# Patient Record
Sex: Female | Born: 2000 | Race: Black or African American | Hispanic: No | Marital: Single | Smoking: Never smoker
Health system: Southern US, Community
[De-identification: ages and names within clinical notes are randomized; demographics above are authoritative.]

## PROBLEM LIST (undated history)

## (undated) ENCOUNTER — Ambulatory Visit: Payer: No Typology Code available for payment source | Source: Home / Self Care

## (undated) DIAGNOSIS — L709 Acne, unspecified: Secondary | ICD-10-CM

## (undated) DIAGNOSIS — D649 Anemia, unspecified: Secondary | ICD-10-CM

## (undated) HISTORY — DX: Acne, unspecified: L70.9

## (undated) HISTORY — DX: Anemia, unspecified: D64.9

---
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
## 2020-01-10 ENCOUNTER — Encounter: Payer: Self-pay | Admitting: Dermatology

## 2020-01-10 ENCOUNTER — Ambulatory Visit (INDEPENDENT_AMBULATORY_CARE_PROVIDER_SITE_OTHER): Payer: 59 | Admitting: Dermatology

## 2020-01-10 ENCOUNTER — Other Ambulatory Visit: Payer: Self-pay

## 2020-01-10 VITALS — Wt 114.0 lb

## 2020-01-10 DIAGNOSIS — L700 Acne vulgaris: Secondary | ICD-10-CM | POA: Diagnosis not present

## 2020-01-10 DIAGNOSIS — L400 Psoriasis vulgaris: Secondary | ICD-10-CM

## 2020-01-10 MED ORDER — SULFAMETHOXAZOLE-TRIMETHOPRIM 400-80 MG PO TABS: 1.0000 | ORAL_TABLET | Freq: Two times a day (BID) | ORAL | 3 refills | Status: DC

## 2020-01-10 MED ORDER — FLUOCINOLONE ACETONIDE 0.01 % EX SOLN: Freq: Two times a day (BID) | CUTANEOUS | 3 refills | Status: DC

## 2020-01-10 NOTE — Progress Notes (Signed)
   New Patient   Subjective  Natalie Dixon is a 19 y.o. female who presents for the following: Acne (Mainly on her face - chin and forehead. Does notice an increase with her cycle.  Currently on birth control pill. Using witch hazel and Differn gel.) and Psoriasis (Ongoing in scalp. Still using Selsem Blue.  Itching is minimal and not too bad.  Is noticing more white spots along her hairline.  Possibly refill the Hytone lotion.).  Psoriasis, acne Location: scalp, ears, face Duration: years Quality: worse Associated Signs/Symptoms: Modifying Factors: minocycline Severity:  Timing: Context: patient interested in accutane   The following portions of the chart were reviewed this encounter and updated as appropriate:     Objective  Well appearing patient in no apparent distress; mood and affect are within normal limits.  A focused examination was performed including scalp, face, ears, neck. Relevant physical exam findings are noted in the Assessment and Plan. Focused examination today.  Her psoriasis of the scalp has slightly spread to the frontal hairline and ears.  It is causing mild dyschromia.  We reviewed the causes and treatment options.  She will use generic Synalar solution daily on the involved area; she will avoid this around the eyes.  She will let me know in 1 month how she is doing.  Her acne is of moderate severity with mostly inflammatory bumps.  I would like her to try one more oral antibiotic before deciding on Accutane.  She will take regular strength generic Bactrim twice daily; she understands the risk of vaginal yeast and rash.  If there is adequate improvement, she may choose to cancel her her Accutane start follow-up next month.   Assessment & Plan  oral smz-tmp for acne, synalar sln for psoriasis scalp.

## 2020-01-11 LAB — POCT URINE PREGNANCY: Preg Test, Ur: NEGATIVE

## 2020-01-25 ENCOUNTER — Ambulatory Visit: Payer: 59 | Attending: Internal Medicine

## 2020-01-25 DIAGNOSIS — Z23 Encounter for immunization: Secondary | ICD-10-CM

## 2020-01-25 NOTE — Progress Notes (Signed)
   Covid-19 Vaccination Clinic  Name:  Natalie Dixon    MRN: [id number] DOB: Jul 02, 2001  01/25/2020  Natalie Dixon was observed post Covid-19 immunization for 15 minutes without incident. She was provided with Vaccine Information Sheet and instruction to access the V-Safe system.   Natalie Dixon was instructed to call 911 with any severe reactions post vaccine: Marland Kitchen Difficulty breathing  . Swelling of face and throat  . A fast heartbeat  . A bad rash all over body  . Dizziness and weakness   Immunizations Administered    Name Date Dose VIS Date Route   Pfizer COVID-19 Vaccine 01/25/2020  3:26 PM 0.3 mL 09/23/2019 Intramuscular   Manufacturer: [employer], [employer]   Lot: [id number]   NDC: [address]-4

## 2020-02-08 ENCOUNTER — Other Ambulatory Visit: Payer: Self-pay

## 2020-02-08 ENCOUNTER — Emergency Department (HOSPITAL_BASED_OUTPATIENT_CLINIC_OR_DEPARTMENT_OTHER)
Admission: EM | Admit: 2020-02-08 | Discharge: 2020-02-08 | Disposition: A | Discharge status: Home / Self Care | Payer: No Typology Code available for payment source | Attending: Emergency Medicine | Admitting: Emergency Medicine

## 2020-02-08 ENCOUNTER — Emergency Department (HOSPITAL_BASED_OUTPATIENT_CLINIC_OR_DEPARTMENT_OTHER): Payer: No Typology Code available for payment source

## 2020-02-08 ENCOUNTER — Encounter (HOSPITAL_BASED_OUTPATIENT_CLINIC_OR_DEPARTMENT_OTHER): Payer: Self-pay | Admitting: Emergency Medicine

## 2020-02-08 DIAGNOSIS — Z79899 Other long term (current) drug therapy: Secondary | ICD-10-CM | POA: Insufficient documentation

## 2020-02-08 DIAGNOSIS — R1031 Right lower quadrant pain: Secondary | ICD-10-CM | POA: Diagnosis present

## 2020-02-08 DIAGNOSIS — N201 Calculus of ureter: Secondary | ICD-10-CM | POA: Diagnosis not present

## 2020-02-08 DIAGNOSIS — Z7722 Contact with and (suspected) exposure to environmental tobacco smoke (acute) (chronic): Secondary | ICD-10-CM | POA: Insufficient documentation

## 2020-02-08 DIAGNOSIS — Z793 Long term (current) use of hormonal contraceptives: Secondary | ICD-10-CM | POA: Insufficient documentation

## 2020-02-08 LAB — URINALYSIS, ROUTINE W REFLEX MICROSCOPIC
Bilirubin Urine: NEGATIVE
Glucose, UA: NEGATIVE mg/dL
Ketones, ur: NEGATIVE mg/dL
Leukocytes,Ua: NEGATIVE
Nitrite: NEGATIVE
Protein, ur: NEGATIVE mg/dL
Specific Gravity, Urine: >1.03 — ABNORMAL HIGH (ref 1.005–1.030)
pH: 5.5 (ref 5.0–8.0)

## 2020-02-08 LAB — URINALYSIS, MICROSCOPIC (REFLEX)

## 2020-02-08 LAB — PREGNANCY, URINE: Preg Test, Ur: NEGATIVE

## 2020-02-08 MED ORDER — KETOROLAC TROMETHAMINE 15 MG/ML IJ SOLN
15.0000 mg | Freq: Once | INTRAMUSCULAR | Status: AC
  Administered 2020-02-08: 15 mg via INTRAVENOUS
  Filled 2020-02-08: qty 1

## 2020-02-08 MED ORDER — FENTANYL CITRATE (PF) 100 MCG/2ML IJ SOLN
100.0000 ug | Freq: Once | INTRAMUSCULAR | Status: DC
  Filled 2020-02-08: qty 2

## 2020-02-08 MED ORDER — ONDANSETRON HCL 4 MG/2ML IJ SOLN
4.0000 mg | Freq: Once | INTRAMUSCULAR | Status: AC
  Administered 2020-02-08: 4 mg via INTRAVENOUS
  Filled 2020-02-08: qty 2

## 2020-02-08 MED ORDER — FENTANYL CITRATE (PF) 100 MCG/2ML IJ SOLN
50.0000 ug | Freq: Once | INTRAMUSCULAR | Status: AC
  Administered 2020-02-08: 05:00:00 50 ug via INTRAVENOUS

## 2020-02-08 MED ORDER — SODIUM CHLORIDE 0.9 % IV BOLUS
1000.0000 mL | Freq: Once | INTRAVENOUS | Status: AC
  Administered 2020-02-08: 05:00:00 1000 mL via INTRAVENOUS

## 2020-02-08 MED ORDER — OXYCODONE-ACETAMINOPHEN 5-325 MG PO TABS: 1.0000 | ORAL_TABLET | ORAL | 0 refills | Status: DC | PRN

## 2020-02-08 MED ORDER — ONDANSETRON 8 MG PO TBDP: 8.0000 mg | ORAL_TABLET | Freq: Three times a day (TID) | ORAL | 0 refills | Status: DC | PRN

## 2020-02-08 NOTE — ED Triage Notes (Signed)
Pt c/o right flank since yesterday afternoon.

## 2020-02-08 NOTE — ED Provider Notes (Signed)
[facility] Provider Note: Georgena Spurling, MD, FACEP  CSN: [id number] MRN: [id number] ARRIVAL: 02/08/20 at [place]: [place]  Flank Pain   HISTORY OF PRESENT ILLNESS  02/08/20 4:46 AM Natalie Dixon is a 19 y.o. female who complains of right flank pain radiating to her right lower quadrant.  It began yesterday afternoon about 3 PM then improved.  It returned about 3 AM this morning.  It is severe (10 out of 10) and not significantly affected by movement or palpation.  She has had nausea and vomiting in it.  She has not noted fever, chills, dysuria or hematuria.    Past Medical History:  Diagnosis Date  . Acne     Past Surgical History:  Procedure Laterality Date  . TONSILLECTOMY  2004    Family History  Problem Relation Age of Onset  . Hypertension Mother   . Heart attack Maternal Grandmother   . Hypertension Maternal Grandmother   . Hypertension Maternal Grandfather   . Migraines Neg Hx   . Seizures Neg Hx   . Depression Neg Hx   . Anxiety disorder Neg Hx   . Bipolar disorder Neg Hx   . ADD / ADHD Neg Hx   . Autism Neg Hx     Social History   Tobacco Use  . Smoking status: Passive Smoke Exposure - Never Smoker  . Smokeless tobacco: Never Used  Substance Use Topics  . Alcohol use: No    Alcohol/week: 0.0 standard drinks  . Drug use: No    Prior to Admission medications   Medication Sig Start Date End Date Taking? Authorizing Provider  etonogestrel (NEXPLANON) 68 MG IMPL implant 1 each by Subdermal route once.    [provider]  fluocinolone (SYNALAR) 0.01 % external solution Apply topically 2 (two) times daily. 01/10/20   Lavonna Monarch, MD  ondansetron (ZOFRAN ODT) 8 MG disintegrating tablet Take 1 tablet (8 mg total) by mouth every 8 (eight) hours as needed for nausea or vomiting. 02/08/20   Cacey Willow, Jenny Reichmann, MD  oxyCODONE-acetaminophen (PERCOCET) 5-325 MG tablet Take 1 tablet by mouth every 4 (four) hours as needed  for severe pain. 02/08/20   Murial Beam, Jenny Reichmann, MD    Allergies Patient has no known allergies.   REVIEW OF SYSTEMS  Negative except as noted here or in the History of Present Illness.   PHYSICAL EXAMINATION  Initial Vital Signs Blood pressure (!) 134/99, pulse (!) 122, temperature 98.7 F (37.1 C), temperature source Oral, resp. rate 18, height 5\' 1"  (1.549 m), weight 52.2 kg, SpO2 100 %.  Examination General: Well-developed, well-nourished female in no acute distress; appearance consistent with age of record HENT: normocephalic; atraumatic Eyes: pupils equal, round and reactive to light; extraocular muscles intact Neck: supple Heart: regular rate and rhythm Lungs: clear to auscultation bilaterally Abdomen: soft; nondistended; mild right lower quadrant tenderness; no masses or hepatosplenomegaly; bowel sounds present GU: No CVA tenderness Extremities: No deformity; full range of motion; pulses normal Neurologic: Awake, alert and oriented; motor function intact in all extremities and symmetric; no facial droop Skin: Warm and dry Psychiatric: Normal mood and affect   RESULTS  Summary of this visit's results, reviewed and interpreted by myself:   EKG Interpretation  Date/Time:    Ventricular Rate:    PR Interval:    QRS Duration:   QT Interval:    QTC Calculation:   R Axis:     Text Interpretation:  Laboratory Studies: Results for orders placed or performed during the hospital encounter of 02/08/20 (from the past 24 hour(s))  Urinalysis, Routine w reflex microscopic     Status: Abnormal   Collection Time: 02/08/20  4:45 AM  Result Value Ref Range   Color, Urine YELLOW YELLOW   APPearance HAZY (A) CLEAR   Specific Gravity, Urine >1.030 (H) 1.005 - 1.030   pH 5.5 5.0 - 8.0   Glucose, UA NEGATIVE NEGATIVE mg/dL   Hgb urine dipstick LARGE (A) NEGATIVE   Bilirubin Urine NEGATIVE NEGATIVE   Ketones, ur NEGATIVE NEGATIVE mg/dL   Protein, ur NEGATIVE NEGATIVE mg/dL    Nitrite NEGATIVE NEGATIVE   Leukocytes,Ua NEGATIVE NEGATIVE  Pregnancy, urine     Status: None   Collection Time: 02/08/20  4:45 AM  Result Value Ref Range   Preg Test, Ur NEGATIVE NEGATIVE  Urinalysis, Microscopic (reflex)     Status: Abnormal   Collection Time: 02/08/20  4:45 AM  Result Value Ref Range   RBC / HPF 21-50 0 - 5 RBC/hpf   WBC, UA 0-5 0 - 5 WBC/hpf   Bacteria, UA MANY (A) NONE SEEN   Squamous Epithelial / LPF 6-10 0 - 5   Mucus PRESENT    Imaging Studies: CT Renal Stone Study  Result Date: 02/08/2020 CLINICAL DATA:  Right-sided flank pain EXAM: CT ABDOMEN AND PELVIS WITHOUT CONTRAST TECHNIQUE: Multidetector CT imaging of the abdomen and pelvis was performed following the standard protocol without IV contrast. COMPARISON:  None. FINDINGS: Lower chest: The visualized heart size within normal limits. No pericardial fluid/thickening. No hiatal hernia. The visualized portions of the lungs are clear. Hepatobiliary: Although limited due to the lack of intravenous contrast, normal in appearance without gross focal abnormality. No evidence of calcified gallstones or biliary ductal dilatation. Pancreas:  Unremarkable.  No surrounding inflammatory changes. Spleen: Normal in size. Although limited due to the lack of intravenous contrast, normal in appearance. Adrenals/Urinary Tract: Both adrenal glands appear normal. There is mild right pelvicaliectasis and proximal ureterectasis seen. However there is no obstructing stones noted. There is mild perinephric stranding seen around the pole right kidney. No left-sided renal or collecting system calculi are seen. The bladder is partially decompressed. Stomach/Bowel: The stomach, small bowel, and colon are normal in appearance. No inflammatory changes or obstructive findings. appendix is normal. Vascular/Lymphatic: There are no enlarged abdominal or pelvic lymph nodes. No significant gross vascular findings are present. Reproductive: The uterus and  adnexa are unremarkable. Other: No evidence of abdominal wall mass or hernia. Musculoskeletal: No acute or significant osseous findings. IMPRESSION: Mild right pelvicaliectasis with a mild amount of perinephric stranding. No renal or collecting system calculi however are seen. This could be due to slightly passed stone or mild pyelonephritis. Electronically Signed   By: Jonna Clark M.D.   On: 02/08/2020 06:04    ED COURSE and MDM  Nursing notes, initial and subsequent vitals signs, including pulse oximetry, reviewed and interpreted by myself.  Vitals:   02/08/20 0440 02/08/20 0444  BP:  (!) 134/99  Pulse:  (!) 122  Resp:  18  Temp:  98.7 F (37.1 C)  TempSrc:  Oral  SpO2:  100%  Weight: 52.2 kg   Height: 5\' 1"  (1.549 m)    Medications  ketorolac (TORADOL) 15 MG/ML injection 15 mg (has no administration in time range)  ondansetron (ZOFRAN) injection 4 mg (4 mg Intravenous Given 02/08/20 0507)  fentaNYL (SUBLIMAZE) injection 50 mcg (50 mcg Intravenous Given 02/08/20 0507)  sodium chloride 0.9 % bolus 1,000 mL ( Intravenous Stopped 02/08/20 0624)   6:33 AM Suspect either passed stone or tiny stone not easily seen on CT as the patient's clinical presentation and another CT findings are consistent with ureterolithiasis.   PROCEDURES  Procedures   ED DIAGNOSES     ICD-10-CM   1. Ureterolithiasis  N20.1        Breckan Cafiero, MD 02/08/20 [phone]

## 2020-02-08 NOTE — ED Triage Notes (Signed)
Pt vomiting in triage 

## 2020-02-13 ENCOUNTER — Ambulatory Visit: Payer: Self-pay | Admitting: Dermatology

## 2020-02-16 ENCOUNTER — Ambulatory Visit (INDEPENDENT_AMBULATORY_CARE_PROVIDER_SITE_OTHER): Payer: 59 | Admitting: Physician Assistant

## 2020-02-16 ENCOUNTER — Encounter: Payer: Self-pay | Admitting: Physician Assistant

## 2020-02-16 ENCOUNTER — Other Ambulatory Visit: Payer: Self-pay

## 2020-02-16 DIAGNOSIS — Z5181 Encounter for therapeutic drug level monitoring: Secondary | ICD-10-CM | POA: Diagnosis not present

## 2020-02-16 DIAGNOSIS — L700 Acne vulgaris: Secondary | ICD-10-CM

## 2020-02-16 MED ORDER — ISOTRETINOIN 20 MG PO CAPS: 20.0000 mg | ORAL_CAPSULE | Freq: Every day | ORAL | 0 refills | Status: DC

## 2020-02-16 NOTE — Progress Notes (Signed)
   Isotretinoin Follow-Up Visit   Subjective  Natalie Dixon is a 19 y.o.AA female who presents for the following: Acne (Patient here today for Isotret new start).  The following portions of the chart were reviewed this encounter and updated as appropriate: Tobacco  Allergies  Meds  Problems  Med Hx  Surg Hx  Fam Hx      Isotretinoin F/U - 02/16/20 1100      Isotretinoin Follow Up   iPledge #  [id number]   labs done today   Date  02/16/20    Two Forms of Birth Control  Implant;Female Condom      Dosage   Target Dosage (mg)  6218       Review of Systems: No other skin or systemic complaints.  Objective  Well appearing patient in no apparent distress; mood and affect are within normal limits.  Face examined. Relevant physical exam findings are noted in the Assessment and Plan.  Objective  Head - Anterior (Face): Follicularly based papules and pustules with comedones   Assessment & Plan  Acne vulgaris Head - Anterior (Face)  Other Related Procedures CBC with Differential/Platelet Comprehensive metabolic panel Lipid panel hCG, serum, qualitative  Ordered Medications: ISOtretinoin (ACCUTANE) 20 MG capsule  Encounter for therapeutic drug monitoring  Other Related Procedures CBC with Differential/Platelet Comprehensive metabolic panel Lipid panel hCG, serum, qualitative

## 2020-02-17 LAB — CBC WITH DIFFERENTIAL/PLATELET
Absolute Monocytes: 324 cells/uL (ref 200–900)
Basophils Absolute: 29 cells/uL (ref 0–200)
Basophils Relative: 0.4 %
Eosinophils Absolute: 7 cells/uL — ABNORMAL LOW (ref 15–500)
Eosinophils Relative: 0.1 %
HCT: 39.4 % (ref 34.0–46.0)
Hemoglobin: 13 g/dL (ref 11.5–15.3)
Lymphs Abs: 1735 cells/uL (ref 1200–5200)
MCH: 30.2 pg (ref 25.0–35.0)
MCHC: 33 g/dL (ref 31.0–36.0)
MCV: 91.6 fL (ref 78.0–98.0)
MPV: 10 fL (ref 7.5–12.5)
Monocytes Relative: 4.5 %
Neutro Abs: 5105 cells/uL (ref 1800–8000)
Neutrophils Relative %: 70.9 %
Platelets: 287 10*3/uL (ref 140–400)
RBC: 4.3 10*6/uL (ref 3.80–5.10)
RDW: 11.5 % (ref 11.0–15.0)
Total Lymphocyte: 24.1 %
WBC: 7.2 10*3/uL (ref 4.5–13.0)

## 2020-02-17 LAB — LIPID PANEL
Cholesterol: 133 mg/dL (ref <170)
HDL: 43 mg/dL — ABNORMAL LOW (ref >45)
LDL Cholesterol (Calc): 76 mg/dL (calc) (ref <110)
Non-HDL Cholesterol (Calc): 90 mg/dL (calc) (ref <120)
Total CHOL/HDL Ratio: 3.1 (calc) (ref <5.0)
Triglycerides: 63 mg/dL (ref <90)

## 2020-02-17 LAB — COMPREHENSIVE METABOLIC PANEL
AG Ratio: 2 (calc) (ref 1.0–2.5)
ALT: 12 U/L (ref 5–32)
AST: 17 U/L (ref 12–32)
Albumin: 4.5 g/dL (ref 3.6–5.1)
Alkaline phosphatase (APISO): 63 U/L (ref 36–128)
BUN/Creatinine Ratio: 8 (calc) (ref 6–22)
BUN: 6 mg/dL — ABNORMAL LOW (ref 7–20)
CO2: 22 mmol/L (ref 20–32)
Calcium: 9.5 mg/dL (ref 8.9–10.4)
Chloride: 109 mmol/L (ref 98–110)
Creat: 0.78 mg/dL (ref 0.50–1.00)
Globulin: 2.3 g/dL (calc) (ref 2.0–3.8)
Glucose, Bld: 77 mg/dL (ref 65–99)
Potassium: 3.9 mmol/L (ref 3.8–5.1)
Sodium: 142 mmol/L (ref 135–146)
Total Bilirubin: 0.6 mg/dL (ref 0.2–1.1)
Total Protein: 6.8 g/dL (ref 6.3–8.2)

## 2020-02-17 LAB — HCG, SERUM, QUALITATIVE: Preg, Serum: NEGATIVE

## 2020-03-20 ENCOUNTER — Encounter: Payer: Self-pay | Admitting: Dermatology

## 2020-03-20 ENCOUNTER — Other Ambulatory Visit: Payer: Self-pay

## 2020-03-20 ENCOUNTER — Ambulatory Visit (INDEPENDENT_AMBULATORY_CARE_PROVIDER_SITE_OTHER): Payer: No Typology Code available for payment source | Admitting: Dermatology

## 2020-03-20 ENCOUNTER — Other Ambulatory Visit: Payer: Self-pay | Admitting: *Deleted

## 2020-03-20 DIAGNOSIS — L700 Acne vulgaris: Secondary | ICD-10-CM | POA: Diagnosis not present

## 2020-03-20 MED ORDER — HYDROCORTISONE 2.5 % EX LOTN: TOPICAL_LOTION | Freq: Two times a day (BID) | CUTANEOUS | 5 refills | Status: DC

## 2020-03-20 MED ORDER — ISOTRETINOIN 20 MG PO CAPS: 20.0000 mg | ORAL_CAPSULE | Freq: Every day | ORAL | 0 refills | Status: DC

## 2020-03-21 LAB — CBC WITH DIFFERENTIAL/PLATELET
Absolute Monocytes: 440 cells/uL (ref 200–900)
Basophils Absolute: 50 cells/uL (ref 0–200)
Basophils Relative: 0.8 %
Eosinophils Absolute: 19 cells/uL (ref 15–500)
Eosinophils Relative: 0.3 %
HCT: 38.6 % (ref 34.0–46.0)
Hemoglobin: 12.8 g/dL (ref 11.5–15.3)
Lymphs Abs: 2368 cells/uL (ref 1200–5200)
MCH: 29.9 pg (ref 25.0–35.0)
MCHC: 33.2 g/dL (ref 31.0–36.0)
MCV: 90.2 fL (ref 78.0–98.0)
MPV: 10 fL (ref 7.5–12.5)
Monocytes Relative: 7.1 %
Neutro Abs: 3323 cells/uL (ref 1800–8000)
Neutrophils Relative %: 53.6 %
Platelets: 244 10*3/uL (ref 140–400)
RBC: 4.28 10*6/uL (ref 3.80–5.10)
RDW: 11.8 % (ref 11.0–15.0)
Total Lymphocyte: 38.2 %
WBC: 6.2 10*3/uL (ref 4.5–13.0)

## 2020-03-21 LAB — LIPID PANEL
Cholesterol: 146 mg/dL (ref <170)
HDL: 41 mg/dL — ABNORMAL LOW (ref >45)
LDL Cholesterol (Calc): 80 mg/dL (calc) (ref <110)
Non-HDL Cholesterol (Calc): 105 mg/dL (calc) (ref <120)
Total CHOL/HDL Ratio: 3.6 (calc) (ref <5.0)
Triglycerides: 158 mg/dL — ABNORMAL HIGH (ref <90)

## 2020-03-21 LAB — COMPREHENSIVE METABOLIC PANEL
AG Ratio: 1.8 (calc) (ref 1.0–2.5)
ALT: 13 U/L (ref 5–32)
AST: 23 U/L (ref 12–32)
Albumin: 4.2 g/dL (ref 3.6–5.1)
Alkaline phosphatase (APISO): 61 U/L (ref 36–128)
BUN: 9 mg/dL (ref 7–20)
CO2: 22 mmol/L (ref 20–32)
Calcium: 9.6 mg/dL (ref 8.9–10.4)
Chloride: 109 mmol/L (ref 98–110)
Creat: 0.74 mg/dL (ref 0.50–1.00)
Globulin: 2.3 g/dL (calc) (ref 2.0–3.8)
Glucose, Bld: 92 mg/dL (ref 65–99)
Potassium: 3.7 mmol/L — ABNORMAL LOW (ref 3.8–5.1)
Sodium: 140 mmol/L (ref 135–146)
Total Bilirubin: 0.4 mg/dL (ref 0.2–1.1)
Total Protein: 6.5 g/dL (ref 6.3–8.2)

## 2020-03-21 LAB — HCG, SERUM, QUALITATIVE: Preg, Serum: NEGATIVE

## 2020-03-29 ENCOUNTER — Ambulatory Visit
Admission: EM | Admit: 2020-03-29 | Discharge: 2020-03-29 | Disposition: A | Discharge status: Home / Self Care | Payer: No Typology Code available for payment source

## 2020-03-29 ENCOUNTER — Encounter: Payer: Self-pay | Admitting: Emergency Medicine

## 2020-03-29 ENCOUNTER — Other Ambulatory Visit: Payer: Self-pay

## 2020-03-29 DIAGNOSIS — R21 Rash and other nonspecific skin eruption: Secondary | ICD-10-CM | POA: Diagnosis not present

## 2020-03-29 NOTE — ED Provider Notes (Signed)
[name]    CSN: [id number] Arrival date & time: 03/29/20  4401      History   Chief Complaint Chief Complaint  Patient presents with  . Rash    HPI Natalie Dixon is a 19 y.o. female.   19 year old female comes in for 2 week history of rash. States felt rash to the right lower arm after being at the salon. Denies itching, pain, erythema, warmth. This week, tried hydrocortisone, for which she felt burning sensation when applied. Now rash darker after applying hydrocortisone. Denies spreading of the rash.     Past Medical History:  Diagnosis Date  . Acne     Patient Active Problem List   Diagnosis Date Noted  . Tension headache, chronic 08/01/2016  . Surveillance of implantable subdermal contraceptive 12/04/2015  . Menorrhagia with regular cycle 08/30/2015  . Migraine without aura 08/30/2015  . Iron deficiency anemia 08/30/2015    Past Surgical History:  Procedure Laterality Date  . TONSILLECTOMY  2004    OB History   No obstetric history on file.      Home Medications    Prior to Admission medications   Medication Sig Start Date End Date Taking? Authorizing Provider  ISOtretinoin (ACCUTANE) 20 MG capsule Take 1 capsule (20 mg total) by mouth daily. 03/20/20  Yes Lavonna Monarch, MD  etonogestrel (NEXPLANON) 68 MG IMPL implant 1 each by Subdermal route once.    [provider]  fluocinolone (SYNALAR) 0.01 % external solution Apply topically 2 (two) times daily. 01/10/20   Lavonna Monarch, MD  hydrocortisone 2.5 % lotion Apply topically 2 (two) times daily. 03/20/20   Lavonna Monarch, MD    Family History Family History  Problem Relation Age of Onset  . Hypertension Mother   . Heart attack Maternal Grandmother   . Hypertension Maternal Grandmother   . Hypertension Maternal Grandfather   . Migraines Neg Hx   . Seizures Neg Hx   . Depression Neg Hx   . Anxiety disorder Neg Hx   . Bipolar disorder Neg Hx   . ADD / ADHD Neg Hx   .  Autism Neg Hx     Social History Social History   Tobacco Use  . Smoking status: Passive Smoke Exposure - Never Smoker  . Smokeless tobacco: Never Used  Vaping Use  . Vaping Use: Never used  Substance Use Topics  . Alcohol use: No    Alcohol/week: 0.0 standard drinks  . Drug use: No     Allergies   Patient has no known allergies.   Review of Systems Review of Systems  Reason unable to perform ROS: See HPI as above.     Physical Exam Triage Vital Signs ED Triage Vitals  Enc Vitals Group     BP 03/29/20 0900 120/73     Pulse Rate 03/29/20 0900 92     Resp 03/29/20 0900 16     Temp 03/29/20 0900 98.2 F (36.8 C)     Temp Source 03/29/20 0900 Oral     SpO2 03/29/20 0900 98 %     Weight --      Height --      Head Circumference --      Peak Flow --      Pain Score 03/29/20 0857 0     Pain Loc --      Pain Edu? --      Excl. in [place]? --    No data found.  Updated Vital  Signs BP 120/73 (BP Location: Left Arm)   Pulse 92   Temp 98.2 F (36.8 C) (Oral)   Resp 16   SpO2 98%   Physical Exam Constitutional:      General: She is not in acute distress.    Appearance: Normal appearance. She is well-developed. She is not toxic-appearing or diaphoretic.  HENT:     Head: Normocephalic and atraumatic.  Eyes:     Conjunctiva/sclera: Conjunctivae normal.     Pupils: Pupils are equal, round, and reactive to light.  Pulmonary:     Effort: Pulmonary effort is normal. No respiratory distress.     Comments: Speaking in full sentences without difficulty Musculoskeletal:     Cervical back: Normal range of motion and neck supple.  Skin:    General: Skin is warm and dry.     Comments: Maculopapular rash with hyperpigmentation and skin drying to the right medial proximal forearm. No tenderness to palpation. No erythema, warmth. No vesicular rash.  Neurological:     Mental Status: She is alert and oriented to person, place, and time.     UC Treatments / Results   Labs (all labs ordered are listed, but only abnormal results are displayed) Labs Reviewed - No data to display  EKG   Radiology No results found.  Procedures Procedures (including critical care time)  Medications Ordered in UC Medications - No data to display  Initial Impression / Assessment and Plan / UC Course  I have reviewed the triage vital signs and the nursing notes.  Pertinent labs & imaging results that were available during my care of the patient were reviewed by me and considered in my medical decision making (see chart for details).    Will have patient stop hydrocortisone for now and monitor. Moisturize and avoid further irritation. Return precautions given. Otherwise follow up with PCP/dermatology if symptoms does not improve.  Final Clinical Impressions(s) / UC Diagnoses   Final diagnoses:  Rash   ED Prescriptions    None     PDMP not reviewed this encounter.   Belinda Fisher, PA-C 03/29/20 [phone]

## 2020-03-29 NOTE — Discharge Instructions (Signed)
Stop hydrocortisone. Moisturize area and monitor for the week. Follow up with PCP if rash not improving.

## 2020-03-29 NOTE — ED Triage Notes (Signed)
2 weeks ago, thought she felt bumps on right arm after being at a salon.  Rash is on right lower arm at elbow.  Patient denies itching, rash has darker pigmentation, has used hydrocortisone on rash.  Rash burns with hydrocortisone application.

## 2020-04-13 ENCOUNTER — Encounter: Payer: Self-pay | Admitting: Dermatology

## 2020-04-13 NOTE — Progress Notes (Signed)
   Follow-Up Visit   Subjective  Natalie Dixon is a 19 y.o. female who presents for the following: Acne (31 day fasting taking isotretinoin 20mg ).  Acne Location: Predominantly face Duration:  Quality: Improved Associated Signs/Symptoms: Modifying Factors:  Severity:  Timing: Context:   The following portions of the chart were reviewed this encounter and updated as appropriate: Tobacco  Allergies  Meds  Problems  Med Hx  Surg Hx  Fam Hx      Objective  Well appearing patient in no apparent distress; mood and affect are within normal limits.  A focused examination was performed including Head and neck.. Relevant physical exam findings are noted in the Assessment and Plan.   Assessment & Plan  Acne vulgaris (2) Left Buccal Cheek ; Right Buccal Cheek   Continue isotretinoin 20 mg daily.  Pregnancy avoidance.  I pledge compliance.  Other Related Procedures CBC with Differential/Platelet Comprehensive metabolic panel Lipid panel hCG, serum, qualitative  Other Related Medications ISOtretinoin (ACCUTANE) 20 MG capsule hydrocortisone 2.5 % lotion

## 2020-04-23 ENCOUNTER — Ambulatory Visit (INDEPENDENT_AMBULATORY_CARE_PROVIDER_SITE_OTHER): Payer: BLUE CROSS/BLUE SHIELD | Admitting: Physician Assistant

## 2020-04-23 ENCOUNTER — Encounter: Payer: Self-pay | Admitting: Physician Assistant

## 2020-04-23 ENCOUNTER — Other Ambulatory Visit: Payer: Self-pay

## 2020-04-23 DIAGNOSIS — L700 Acne vulgaris: Secondary | ICD-10-CM

## 2020-04-23 DIAGNOSIS — Z5181 Encounter for therapeutic drug level monitoring: Secondary | ICD-10-CM | POA: Diagnosis not present

## 2020-04-23 MED ORDER — ISOTRETINOIN 30 MG PO CAPS: 30.0000 mg | ORAL_CAPSULE | Freq: Every day | ORAL | 0 refills | Status: DC

## 2020-04-23 NOTE — Progress Notes (Signed)
   Isotretinoin Follow-Up Visit   Subjective  Natalie Dixon is a 19 y.o. female who presents for the following: Follow-up (31 DAY). She feels like she is having less new bumps but still a mild breakout with menses. No headaches or joint aches. Using vaseline on lips.  The following portions of the chart were reviewed this encounter and updated as appropriate: Tobacco  Allergies  Meds  Problems  Med Hx  Surg Hx  Fam Hx       Isotretinoin F/U - 04/23/20 1000      Isotretinoin Follow Up   iPledge # [id number]    Date 04/23/20    Two Forms of Birth Control Implant;Female Condom      Dosage   Target Dosage (mg) 6218    Current (To Date) Dosage (mg) 1200    To Go Dosage (mg) 5018      Side Effects   Skin Chapped Lips    Gastrointestinal WNL    Neurological WNL    Constitutional WNL           Review of Systems: No other skin or systemic complaints.  Objective  Well appearing patient in no apparent distress; mood and affect are within normal limits.  Face examined. Relevant physical exam findings are noted in the Assessment and Plan.   Assessment & Plan  Acne vulgaris Head - Anterior (Face)  ISOtretinoin (CLARAVIS) 30 MG capsule - Head - Anterior (Face)  Other Related Procedures Pregnancy, urine  Other Related Medications hydrocortisone 2.5 % lotion  Encounter for therapeutic drug monitoring  Other Related Procedures Pregnancy, urine

## 2020-04-24 LAB — PREGNANCY, URINE: Preg Test, Ur: NEGATIVE

## 2020-05-24 ENCOUNTER — Ambulatory Visit (INDEPENDENT_AMBULATORY_CARE_PROVIDER_SITE_OTHER): Payer: No Typology Code available for payment source | Admitting: Physician Assistant

## 2020-05-24 ENCOUNTER — Other Ambulatory Visit: Payer: Self-pay

## 2020-05-24 ENCOUNTER — Encounter: Payer: Self-pay | Admitting: Physician Assistant

## 2020-05-24 DIAGNOSIS — L700 Acne vulgaris: Secondary | ICD-10-CM

## 2020-05-24 DIAGNOSIS — R21 Rash and other nonspecific skin eruption: Secondary | ICD-10-CM

## 2020-05-24 MED ORDER — TRIAMCINOLONE ACETONIDE 0.1 % EX CREA: 1.0000 | TOPICAL_CREAM | Freq: Two times a day (BID) | CUTANEOUS | 2 refills | Status: DC | PRN

## 2020-05-24 MED ORDER — ISOTRETINOIN 30 MG PO CAPS: 30.0000 mg | ORAL_CAPSULE | Freq: Every day | ORAL | 0 refills | Status: DC

## 2020-05-24 NOTE — Addendum Note (Signed)
Addended by: Judd Gaudier on: 05/24/2020 08:32 AM   Modules accepted: Orders

## 2020-05-24 NOTE — Progress Notes (Signed)
   Follow-Up Visit   Subjective  Natalie Dixon is a 19 y.o. female who presents for the following: Acne and Skin Problem (both arms have a rash/dry skin).   The following portions of the chart were reviewed this encounter and updated as appropriate: Tobacco  Allergies  Meds  Problems  Med Hx  Surg Hx  Fam Hx  She has no joint pain, mood swings, suicidal tendencies or headaches.   Objective  Well appearing patient in no apparent distress; mood and affect are within normal limits.  All skin waist up examined.  Objective  Head - Anterior (Face): Acne is completely clear.  Objective  Left Forearm - Anterior, Right Forearm - Anterior: Thin scaly erythematous papules both forearms. Isotretinoin induced  most likely. Scalp dermatitis clear.  Assessment & Plan  Acne vulgaris Head - Anterior (Face)  hydrocortisone 2.5 % lotion - Head - Anterior (Face)  Other Related Procedures Pregnancy, urine  Reordered Medications ISOtretinoin (CLARAVIS) 30 MG capsule  Rash and other nonspecific skin eruption (2) Left Forearm - Anterior; Right Forearm - Anterior  Moisturize after bathing.  triamcinolone cream (KENALOG) 0.1 % - Left Forearm - Anterior   I, Pasha Broad, PA-C, have reviewed all documentation's for this visit.  The documentation on 05/24/20 for the exam, diagnosis, procedures and orders are all accurate and complete.

## 2020-05-24 NOTE — Patient Instructions (Signed)
Isotretinoin capsules What is this medicine? ISOTRETINOIN (eye soe TRET i noyn) treats severe acne that has not responded to other therapy like antibiotics. This medicine may be used for other purposes; ask your health care provider or pharmacist if you have questions. COMMON BRAND NAME(S): Absorica, Absorica LD, Accutane, Amnesteem, Claravis, MYORISAN, Sotret, ZENATANE What should I tell my health care provider before I take this medicine? They need to know if you have any of these conditions:  heart disease  high blood cholesterol or triglycerides  inflammatory bowel disease  liver disease  mental problems, such as depression, psychosis, attempted suicide, or a family history of mental problems  osteoporosis, osteomalacia, or other bone disorders  pancreatitis  an unusual or allergic reaction to isotretinoin, vitamin A or related drugs, parabens, other medicines, foods, dyes, or preservatives  pregnant or trying to get pregnant  breast-feeding How should I use this medicine? Take this medicine by mouth with a full glass of water. Follow the directions on the prescription label. Do not chew or suck on the capsules. Take your medicine at regular intervals. Do not take your medicine more often than directed. Do not stop taking except on your doctor's advice. A special MedGuide will be given to you by the pharmacist with each prescription and refill. Be sure to read this information carefully each time. Talk to your pediatrician regarding the use of this medicine in children. Special care may be needed. Overdosage: If you think you have taken too much of this medicine contact a poison control center or emergency room at once. NOTE: This medicine is only for you. Do not share this medicine with others. What if I miss a dose? If you miss a dose, skip that dose. Do not take double or extra doses. If you take more than your prescribed dose, call your doctor or poison control center right  away. What may interact with this medicine? Do not take this medicine with any of the following medications:  vitamins and other supplements containing vitamin A This medicine may also interact with the following medications:  alcohol  benzoyl peroxide, salicylic acid, or other drying medicines used for acne  medicines for seizures  orlistat  other drugs that make you more sensitive to the sun such as sulfa drugs  progestin-only birth control hormones  st. john's wort  steroid medicines like prednisone or cortisone  tetracycline antibiotics like doxycycline and tetracycline  warfarin This list may not describe all possible interactions. Give your health care provider a list of all the medicines, herbs, non-prescription drugs, or dietary supplements you use. Also tell them if you smoke, drink alcohol, or use illegal drugs. Some items may interact with your medicine. What should I watch for while using this medicine? You may experience a flare in your acne during the initial treatment period. You will need to see your doctor or health care professional monthly to get a new prescription and to check on your progress and for side effects. To receive this medicine, you, your doctor and your pharmacy must be registered in the iPLED[employer] program. You may only receive up to a 30 day supply of this medicine at one time. You will need a new prescription for each refill. Your prescription must be filled within 7 days of your doctor's office visit. This medicine can cause birth defects. Do not get pregnant while taking this drug. Females will need to have 2 negative pregnancy tests before starting this medicine and then monthly pregnancy tests during treatment,   even if you are not sexually active. Use 2 reliable forms of birth control together for 1 month prior to, during, and for 1 month after stopping this medicine. Avoid using birth control pills that do not contain estrogen. They may not work  while you are taking this medicine. If you become pregnant, miss a menstrual cycle, or stop using birth control, you must immediately stop taking this medicine. If you are pregnant, report it to FDA MedWatch at 1-800-FDA-1088 and the iPL[employer] pregnancy registry at 1-866-495-0654. Severe birth defects may occur even if just one dose is taken. Do not breast-feed while taking this medicine or for 1 month after stopping treatment. Do not give blood while taking this medicine and for 30 days after completion of treatment to avoid exposing pregnant women to this medicine through the donated blood. Some patients have become depressed or developed serious mental problems while taking this medicine or soon after stopping. Stop taking this medicine if you start feeling depressed or have thoughts of violence or suicide. Contact your doctor. This medicine can increase cholesterol and triglyceride levels and decrease HDL (the good cholesterol) levels. Your health care provider will monitor these levels and recommend appropriate therapy, including changes in diet or prescription drugs, if necessary. Alcohol can increase the risk of developing high cholesterol or high blood lipids. Avoid alcoholic drinks while you are taking this medicine. If you wear contact lenses, they may feel uncomfortable. If your eyes get dry, check with your eye doctor. This medicine may decrease your night vision or cause other changes in vision. If you experience any change in vision, stop taking this medicine and see an eye doctor. This medicine can make you more sensitive to the sun. Keep out of the sun. If you cannot avoid being in the sun, wear protective clothing and use sunscreen. Do not use sun lamps or tanning beds/booths. Cosmetic procedures to smooth your skin including waxing, dermabrasion, or laser therapy should be avoided during therapy and for at least 6 months after you stop because of the possibility of scarring. Check with your  health care provider for advice about when you can have cosmetic procedures. This medicine may affect your blood sugar levels. If you have diabetes check with your doctor or health care professional if you notice any change in your blood sugar tests. What side effects may I notice from receiving this medicine? Side effects that you should report to your doctor or health care professional as soon as possible:  allergic reactions like skin rash, itching or hives, swelling of the face, lips, or tongue  breathing problems  changes in menstrual cycle  changes in vision, like blurred or double vision or decreased night vision  chest pain  depression  dizziness  fainting  hearing loss or ringing in the ears  hives, skin rash  increased irritability, anger, aggression or thoughts of violence  increased urination and/or thirst or dark urine  irregular heartbeat  loss of interest in usual activities  muscle or joint pain  muscle weakness with or without pain  nausea and vomiting  severe diarrhea  severe headache  severe stomach pain  slurred speech or trouble swallowing  start to have thoughts about hurting yourself  swelling of face or mouth  unusual bruising or bleeding  yellowing of the eyes or skin Side effects that usually do not require medical attention (report to your doctor or health care professional if they continue or are bothersome):  chapped lips  dry mouth, nose   or skin  flushing  hair loss, increased fragility of hair  headache (mild) This list may not describe all possible side effects. Call your doctor for medical advice about side effects. You may report side effects to FDA at 1-800-FDA-1088. Where should I keep my medicine? Keep out of the reach of children. Store at room temperature between 15 and 30 degrees C (59 and 86 degrees F). Throw away any unused medicine after the expiration date. NOTE: This sheet is a summary. It may not cover  all possible information. If you have questions about this medicine, talk to your doctor, pharmacist, or health care provider.  2020 Elsevier/Gold Standard (2008-02-15 16:57:13)  

## 2020-05-25 LAB — PREGNANCY, URINE: Preg Test, Ur: NEGATIVE

## 2020-06-22 DIAGNOSIS — R5383 Other fatigue: Secondary | ICD-10-CM | POA: Diagnosis not present

## 2020-06-25 ENCOUNTER — Ambulatory Visit (INDEPENDENT_AMBULATORY_CARE_PROVIDER_SITE_OTHER): Payer: No Typology Code available for payment source | Admitting: Dermatology

## 2020-06-25 ENCOUNTER — Other Ambulatory Visit: Payer: Self-pay

## 2020-06-25 ENCOUNTER — Encounter: Payer: Self-pay | Admitting: Dermatology

## 2020-06-25 DIAGNOSIS — L700 Acne vulgaris: Secondary | ICD-10-CM | POA: Diagnosis not present

## 2020-06-25 MED ORDER — ISOTRETINOIN 30 MG PO CAPS: 30.0000 mg | ORAL_CAPSULE | Freq: Every day | ORAL | 0 refills | Status: DC

## 2020-06-26 LAB — PREGNANCY, URINE: Preg Test, Ur: NEGATIVE

## 2020-07-12 DIAGNOSIS — N921 Excessive and frequent menstruation with irregular cycle: Secondary | ICD-10-CM | POA: Diagnosis not present

## 2020-07-12 DIAGNOSIS — Z0001 Encounter for general adult medical examination with abnormal findings: Secondary | ICD-10-CM | POA: Diagnosis not present

## 2020-07-12 DIAGNOSIS — Z0000 Encounter for general adult medical examination without abnormal findings: Secondary | ICD-10-CM | POA: Diagnosis not present

## 2020-07-12 DIAGNOSIS — Z113 Encounter for screening for infections with a predominantly sexual mode of transmission: Secondary | ICD-10-CM | POA: Diagnosis not present

## 2020-07-12 DIAGNOSIS — Z6821 Body mass index (BMI) 21.0-21.9, adult: Secondary | ICD-10-CM | POA: Diagnosis not present

## 2020-07-12 DIAGNOSIS — Z3046 Encounter for surveillance of implantable subdermal contraceptive: Secondary | ICD-10-CM | POA: Diagnosis not present

## 2020-07-20 ENCOUNTER — Encounter: Payer: Self-pay | Admitting: Dermatology

## 2020-07-20 NOTE — Progress Notes (Signed)
   Follow-Up Visit   Subjective  Natalie Dixon is a 19 y.o. female who presents for the following: Acne (31 days isotret).  Acne Location: Mostly face Duration:  Quality:  Associated Signs/Symptoms: Modifying Factors: Stretton none Severity:  Timing: Context:   Objective  Well appearing patient in no apparent distress; mood and affect are within normal limits.  A focused examination was performed including Head and neck.. Relevant physical exam findings are noted in the Assessment and Plan.   Assessment & Plan    Acne vulgaris (2) Head - Anterior (Face)  Continue current dosing.  Stay compliant with I pledge.  Avoid pregnancy.  Other Related Procedures Pregnancy, urine  Reordered Medications ISOtretinoin (CLARAVIS) 30 MG capsule  Other Related Medications hydrocortisone 2.5 % lotion     I, Janalyn Harder, MD, have reviewed all documentation for this visit.  The documentation on 07/20/20 for the exam, diagnosis, procedures, and orders are all accurate and complete.

## 2020-07-26 ENCOUNTER — Ambulatory Visit (INDEPENDENT_AMBULATORY_CARE_PROVIDER_SITE_OTHER): Payer: No Typology Code available for payment source | Admitting: Dermatology

## 2020-07-26 ENCOUNTER — Other Ambulatory Visit: Payer: Self-pay

## 2020-07-26 DIAGNOSIS — L700 Acne vulgaris: Secondary | ICD-10-CM | POA: Diagnosis not present

## 2020-07-26 MED ORDER — ISOTRETINOIN 30 MG PO CAPS: 30.0000 mg | ORAL_CAPSULE | Freq: Every day | ORAL | 0 refills | Status: DC

## 2020-07-27 LAB — PREGNANCY, URINE: Preg Test, Ur: NEGATIVE

## 2020-08-25 ENCOUNTER — Emergency Department (HOSPITAL_BASED_OUTPATIENT_CLINIC_OR_DEPARTMENT_OTHER)
Admission: EM | Admit: 2020-08-25 | Discharge: 2020-08-25 | Disposition: A | Discharge status: Home / Self Care | Payer: No Typology Code available for payment source | Attending: Emergency Medicine | Admitting: Emergency Medicine

## 2020-08-25 ENCOUNTER — Other Ambulatory Visit: Payer: Self-pay

## 2020-08-25 ENCOUNTER — Encounter (HOSPITAL_BASED_OUTPATIENT_CLINIC_OR_DEPARTMENT_OTHER): Payer: Self-pay | Admitting: Emergency Medicine

## 2020-08-25 DIAGNOSIS — M549 Dorsalgia, unspecified: Secondary | ICD-10-CM | POA: Diagnosis not present

## 2020-08-25 DIAGNOSIS — Z7722 Contact with and (suspected) exposure to environmental tobacco smoke (acute) (chronic): Secondary | ICD-10-CM | POA: Insufficient documentation

## 2020-08-25 DIAGNOSIS — N3000 Acute cystitis without hematuria: Secondary | ICD-10-CM

## 2020-08-25 DIAGNOSIS — R350 Frequency of micturition: Secondary | ICD-10-CM | POA: Diagnosis present

## 2020-08-25 LAB — URINALYSIS, ROUTINE W REFLEX MICROSCOPIC
Bilirubin Urine: NEGATIVE
Glucose, UA: NEGATIVE mg/dL
Hgb urine dipstick: NEGATIVE
Ketones, ur: NEGATIVE mg/dL
Nitrite: NEGATIVE
Protein, ur: NEGATIVE mg/dL
Specific Gravity, Urine: >1.03 — ABNORMAL HIGH (ref 1.005–1.030)
pH: 5.5 (ref 5.0–8.0)

## 2020-08-25 LAB — URINALYSIS, MICROSCOPIC (REFLEX)

## 2020-08-25 LAB — PREGNANCY, URINE: Preg Test, Ur: NEGATIVE

## 2020-08-25 MED ORDER — SULFAMETHOXAZOLE-TRIMETHOPRIM 800-160 MG PO TABS
1.0000 | ORAL_TABLET | Freq: Once | ORAL | Status: AC
  Administered 2020-08-25: 1 via ORAL
  Filled 2020-08-25: qty 1

## 2020-08-25 MED ORDER — SULFAMETHOXAZOLE-TRIMETHOPRIM 800-160 MG PO TABS: 1.0000 | ORAL_TABLET | Freq: Two times a day (BID) | ORAL | 0 refills | Status: AC

## 2020-08-25 MED ORDER — PHENAZOPYRIDINE HCL 100 MG PO TABS
200.0000 mg | ORAL_TABLET | Freq: Once | ORAL | Status: AC
  Administered 2020-08-25: 200 mg via ORAL
  Filled 2020-08-25: qty 2

## 2020-08-25 MED ORDER — PHENAZOPYRIDINE HCL 200 MG PO TABS
200.0000 mg | ORAL_TABLET | Freq: Three times a day (TID) | ORAL | 0 refills | Status: DC
Start: 2020-08-25 — End: 2021-01-08

## 2020-08-25 NOTE — ED Provider Notes (Signed)
[facility] EMERGENCY DEPARTMENT Provider Note   CSN: [id number] Arrival date & time: 08/25/20  1761     History Chief Complaint  Patient presents with  . Urinary Frequency    Natalie Dixon is a 19 y.o. female.  Pt presents to the ED today with urinary frequency.  She said it started on Wednesday, 11/10.  Pt has a little right sided back pain.  No f/c. No n/v. No vaginal d/c. No other sx.        Past Medical History:  Diagnosis Date  . Acne     Patient Active Problem List   Diagnosis Date Noted  . Tension headache, chronic 08/01/2016  . Surveillance of implantable subdermal contraceptive 12/04/2015  . Menorrhagia with regular cycle 08/30/2015  . Migraine without aura 08/30/2015  . Iron deficiency anemia 08/30/2015    Past Surgical History:  Procedure Laterality Date  . TONSILLECTOMY  2004     OB History   No obstetric history on file.     Family History  Problem Relation Age of Onset  . Hypertension Mother   . Heart attack Maternal Grandmother   . Hypertension Maternal Grandmother   . Hypertension Maternal Grandfather   . Migraines Neg Hx   . Seizures Neg Hx   . Depression Neg Hx   . Anxiety disorder Neg Hx   . Bipolar disorder Neg Hx   . ADD / ADHD Neg Hx   . Autism Neg Hx     Social History   Tobacco Use  . Smoking status: Passive Smoke Exposure - Never Smoker  . Smokeless tobacco: Never Used  Vaping Use  . Vaping Use: Never used  Substance Use Topics  . Alcohol use: No    Alcohol/week: 0.0 standard drinks  . Drug use: No    Home Medications Prior to Admission medications   Medication Sig Start Date End Date Taking? Authorizing Provider  etonogestrel (NEXPLANON) 68 MG IMPL implant 1 each by Subdermal route once.    [provider]  fluocinolone (SYNALAR) 0.01 % external solution Apply topically 2 (two) times daily. 01/10/20   Janalyn Harder, MD  hydrocortisone 2.5 % lotion Apply topically 2 (two) times daily.  03/20/20   Janalyn Harder, MD  ISOtretinoin (CLARAVIS) 30 MG capsule Take 1 capsule (30 mg total) by mouth daily. 07/26/20   Janalyn Harder, MD  phenazopyridine (PYRIDIUM) 200 MG tablet Take 1 tablet (200 mg total) by mouth 3 (three) times daily. 08/25/20   Jacalyn Lefevre, MD  sulfamethoxazole-trimethoprim (BACTRIM DS) 800-160 MG tablet Take 1 tablet by mouth 2 (two) times daily for 7 days. 08/25/20 09/01/20  Jacalyn Lefevre, MD  triamcinolone cream (KENALOG) 0.1 % Apply 1 application topically 2 (two) times daily as needed. 05/24/20   Glyn Ade, PA-C    Allergies    Patient has no known allergies.  Review of Systems   Review of Systems  Genitourinary: Positive for frequency.  All other systems reviewed and are negative.   Physical Exam Updated Vital Signs BP 128/80   Pulse 93   Temp 98.1 F (36.7 C) (Oral)   Resp 16   Ht 5\' 1"  (1.549 m)   Wt 49.9 kg   SpO2 100%   BMI 20.78 kg/m   Physical Exam Vitals and nursing note reviewed.  Constitutional:      Appearance: Normal appearance.  HENT:     Head: Normocephalic and atraumatic.     Right Ear: External ear normal.     Left  Ear: External ear normal.     Nose: Nose normal.     Mouth/Throat:     Mouth: Mucous membranes are moist.     Pharynx: Oropharynx is clear.  Eyes:     Extraocular Movements: Extraocular movements intact.     Conjunctiva/sclera: Conjunctivae normal.     Pupils: Pupils are equal, round, and reactive to light.  Cardiovascular:     Rate and Rhythm: Normal rate and regular rhythm.     Pulses: Normal pulses.     Heart sounds: Normal heart sounds.  Pulmonary:     Effort: Pulmonary effort is normal.     Breath sounds: Normal breath sounds.  Abdominal:     General: Abdomen is flat. Bowel sounds are normal.     Palpations: Abdomen is soft.  Musculoskeletal:        General: Normal range of motion.     Cervical back: Normal range of motion and neck supple.  Skin:    General: Skin is warm.      Capillary Refill: Capillary refill takes less than 2 seconds.  Neurological:     General: No focal deficit present.     Mental Status: She is alert and oriented to person, place, and time.  Psychiatric:        Mood and Affect: Mood normal.        Behavior: Behavior normal.     ED Results / Procedures / Treatments   Labs (all labs ordered are listed, but only abnormal results are displayed) Labs Reviewed  URINALYSIS, ROUTINE W REFLEX MICROSCOPIC - Abnormal; Notable for the following components:      Result Value   APPearance CLOUDY (*)    Specific Gravity, Urine >1.030 (*)    Leukocytes,Ua TRACE (*)    All other components within normal limits  URINALYSIS, MICROSCOPIC (REFLEX) - Abnormal; Notable for the following components:   Bacteria, UA MANY (*)    All other components within normal limits  PREGNANCY, URINE    EKG None  Radiology No results found.  Procedures Procedures (including critical care time)  Medications Ordered in ED Medications  sulfamethoxazole-trimethoprim (BACTRIM DS) 800-160 MG per tablet 1 tablet (1 tablet Oral Given 08/25/20 0741)  phenazopyridine (PYRIDIUM) tablet 200 mg (200 mg Oral Given 08/25/20 0741)    ED Course  I have reviewed the triage vital signs and the nursing notes.  Pertinent labs & imaging results that were available during my care of the patient were reviewed by me and considered in my medical decision making (see chart for details).    MDM Rules/Calculators/A&P                          Urine has some contamination, but does have many bacteria.  She has urinary sx, so I will treat her with bactrim/pyridium.  There is no blood and she does not appear to be in pain, so I doubt she has a kidney stone.  Pt is stable for d/c.  Return if worse.  Final Clinical Impression(s) / ED Diagnoses Final diagnoses:  Acute cystitis without hematuria    Rx / DC Orders ED Discharge Orders         Ordered    sulfamethoxazole-trimethoprim  (BACTRIM DS) 800-160 MG tablet  2 times daily        08/25/20 0733    phenazopyridine (PYRIDIUM) 200 MG tablet  3 times daily        08/25/20 4166  Jacalyn Lefevre, MD 08/25/20 [phone]

## 2020-08-25 NOTE — ED Triage Notes (Signed)
Pt c/o right flank pain and urinary frequency

## 2020-08-27 ENCOUNTER — Other Ambulatory Visit: Payer: Self-pay

## 2020-08-27 ENCOUNTER — Ambulatory Visit (INDEPENDENT_AMBULATORY_CARE_PROVIDER_SITE_OTHER): Payer: Medicaid Other | Admitting: Dermatology

## 2020-08-27 VITALS — Wt 110.0 lb

## 2020-08-27 DIAGNOSIS — L700 Acne vulgaris: Secondary | ICD-10-CM | POA: Diagnosis not present

## 2020-08-27 MED ORDER — ISOTRETINOIN 30 MG PO CAPS: 30.0000 mg | ORAL_CAPSULE | Freq: Every day | ORAL | 0 refills | Status: DC

## 2020-08-28 LAB — PREGNANCY, URINE: Preg Test, Ur: NEGATIVE

## 2020-08-31 ENCOUNTER — Encounter: Payer: Self-pay | Admitting: Dermatology

## 2020-08-31 NOTE — Progress Notes (Signed)
   Follow-Up Visit   Subjective  Sobia Karger is a 19 y.o. female who presents for the following: Acne (31 day follow up).  Cystic acne Location: Face Duration:  Quality: Clear Associated Signs/Symptoms: Modifying Factors: Isotretinoin Severity:  Timing: Context:   Objective  Well appearing patient in no apparent distress; mood and affect are within normal limits.  A focused examination was performed including Head and neck.. Relevant physical exam findings are noted in the Assessment and Plan.   Assessment & Plan    Acne vulgaris Head - Anterior (Face)  Continue current dosing.  Avoid pregnancy.  Will likely continue therapy for 2 months before stopping.  Pregnancy, urine - Head - Anterior (Face)  ISOtretinoin (CLARAVIS) 30 MG capsule - Head - Anterior (Face)     I, Janalyn Harder, MD, have reviewed all documentation for this visit.  The documentation on 08/31/20 for the exam, diagnosis, procedures, and orders are all accurate and complete.

## 2020-09-28 ENCOUNTER — Ambulatory Visit: Payer: No Typology Code available for payment source | Attending: Internal Medicine

## 2020-09-28 DIAGNOSIS — Z23 Encounter for immunization: Secondary | ICD-10-CM

## 2020-09-28 NOTE — Progress Notes (Signed)
° °  Covid-19 Vaccination Clinic  Name:  Calla Wedekind    MRN: [id number] DOB: 2001-06-12  09/28/2020  Ms. Nyako-Reed was observed post Covid-19 immunization for 15 minutes without incident. She was provided with Vaccine Information Sheet and instruction to access the V-Safe system.   Ms. Rudie Meyer was instructed to call 911 with any severe reactions post vaccine:  Difficulty breathing   Swelling of face and throat   A fast heartbeat   A bad rash all over body   Dizziness and weakness   Immunizations Administered    Name Date Dose VIS Date Route   Pfizer COVID-19 Vaccine 09/28/2020  3:14 PM 0.3 mL 08/01/2020 Intramuscular   Manufacturer: [employer], [employer]   Lot: [id number]   NDC: [address]-2

## 2020-10-01 ENCOUNTER — Other Ambulatory Visit: Payer: Self-pay

## 2020-10-01 ENCOUNTER — Encounter: Payer: Self-pay | Admitting: Dermatology

## 2020-10-01 ENCOUNTER — Ambulatory Visit (INDEPENDENT_AMBULATORY_CARE_PROVIDER_SITE_OTHER): Payer: No Typology Code available for payment source | Admitting: Dermatology

## 2020-10-01 DIAGNOSIS — L409 Psoriasis, unspecified: Secondary | ICD-10-CM

## 2020-10-01 DIAGNOSIS — L700 Acne vulgaris: Secondary | ICD-10-CM | POA: Diagnosis not present

## 2020-10-01 DIAGNOSIS — Z5181 Encounter for therapeutic drug level monitoring: Secondary | ICD-10-CM | POA: Diagnosis not present

## 2020-10-01 MED ORDER — ISOTRETINOIN 30 MG PO CAPS: 30.0000 mg | ORAL_CAPSULE | Freq: Every day | ORAL | 0 refills | Status: DC

## 2020-10-02 LAB — PREGNANCY, URINE: Preg Test, Ur: NEGATIVE

## 2020-10-03 ENCOUNTER — Ambulatory Visit: Payer: No Typology Code available for payment source | Admitting: Dermatology

## 2020-10-03 ENCOUNTER — Encounter: Payer: Self-pay | Admitting: Dermatology

## 2020-10-03 MED FILL — MYORISAN 30 MG CAPSULE: 30 | 30 days supply | Qty: 30 | Fill #0

## 2020-10-03 NOTE — Progress Notes (Signed)
   Follow-Up Visit   Subjective  Natalie Dixon is a 19 y.o. female who presents for the following: No chief complaint on file..  Acne Location: Face Duration:  Quality: Clear Associated Signs/Symptoms: Modifying Factors: Stretton none, final month Severity:  Timing: Context:   Objective  Well appearing patient in no apparent distress; mood and affect are within normal limits. Objective  Head - Anterior (Face): Inflammatory papules completely clear.  No sign of scar or PIH.    A focused examination was performed including Head and neck.. Relevant physical exam findings are noted in the Assessment and Plan.   Assessment & Plan    Acne vulgaris Head - Anterior (Face)  Continue current treatment. Enter patient as last month as therapy.  Continue I pledge compliance.  Call if minor flare in the future, return if significant deep inflammatory papules.  Pregnancy, urine - Head - Anterior (Face)  ISOtretinoin (CLARAVIS) 30 MG capsule - Head - Anterior (Face)  Encounter for medication monitoring  Other Related Procedures Pregnancy, urine  Psoriasis Mid Parietal Scalp  Okay use of triamcinolone the night before she shampoos her hair if the psoriasis returns after being off isotretinoin.  She may also use this if the eczematous patches on her hands do not clear after finishing the isotretinoin.      I, Janalyn Harder, MD, have reviewed all documentation for this visit.  The documentation on 10/03/20 for the exam, diagnosis, procedures, and orders are all accurate and complete.

## 2020-11-01 ENCOUNTER — Ambulatory Visit: Payer: No Typology Code available for payment source | Admitting: Dermatology

## 2020-11-01 DIAGNOSIS — Z1152 Encounter for screening for COVID-19: Secondary | ICD-10-CM | POA: Diagnosis not present

## 2020-12-03 ENCOUNTER — Ambulatory Visit: Payer: No Typology Code available for payment source | Admitting: Dermatology

## 2020-12-27 ENCOUNTER — Other Ambulatory Visit: Payer: Self-pay

## 2020-12-27 ENCOUNTER — Ambulatory Visit
Admission: RE | Admit: 2020-12-27 | Discharge: 2020-12-27 | Disposition: A | Discharge status: Home / Self Care | Payer: No Typology Code available for payment source | Source: Ambulatory Visit | Attending: Family Medicine | Admitting: Family Medicine

## 2020-12-27 VITALS — BP 108/71 | HR 71 | Temp 98.0°F | Resp 18

## 2020-12-27 DIAGNOSIS — J0140 Acute pansinusitis, unspecified: Secondary | ICD-10-CM

## 2020-12-27 MED ORDER — AMOXICILLIN-POT CLAVULANATE 875-125 MG PO TABS: 1.0000 | ORAL_TABLET | Freq: Two times a day (BID) | ORAL | 0 refills | Status: AC

## 2020-12-27 MED ORDER — FLUCONAZOLE 150 MG PO TABS: ORAL_TABLET | ORAL | 0 refills | Status: DC

## 2020-12-27 NOTE — ED Provider Notes (Signed)
[facility]   [id number] 12/27/20 Arrival Time: 1540  YP:PJKD THROAT  SUBJECTIVE: History from: patient.  Natalie Dixon is a 20 y.o. female who presents with abrupt onset of nasal congestion, headache, purulent nasal drainage, fatigue for the last 4 days. Reports an increase in allergy symptoms with nasal congestion for the last 10 days. Denies sick exposure to Covid, strep, flu or mono, or precipitating event. Has negative history of Covid. Has had Covid vaccines and booster. Has tried tylenol without relief.  There are no aggravating symptoms. Reports previous symptoms in the past.     Denies fever, chills, ear pain, rhinorrhea, cough, SOB, wheezing, chest pain, nausea, rash, changes in bowel or bladder habits.     ROS: As per HPI.  All other pertinent ROS negative.     Past Medical History:  Diagnosis Date  . Acne    Past Surgical History:  Procedure Laterality Date  . TONSILLECTOMY  2004   No Known Allergies No current facility-administered medications on file prior to encounter.   Current Outpatient Medications on File Prior to Encounter  Medication Sig Dispense Refill  . etonogestrel (NEXPLANON) 68 MG IMPL implant 1 each by Subdermal route once.    . fluocinolone (SYNALAR) 0.01 % external solution Apply topically 2 (two) times daily. 60 mL 3  . ISOtretinoin (CLARAVIS) 30 MG capsule Take 1 capsule (30 mg total) by mouth daily. 30 capsule 0  . phenazopyridine (PYRIDIUM) 200 MG tablet Take 1 tablet (200 mg total) by mouth 3 (three) times daily. 6 tablet 0  . triamcinolone cream (KENALOG) 0.1 % Apply 1 application topically 2 (two) times daily as needed. 80 g 2   Social History   Socioeconomic History  . Marital status: Single    Spouse name: Not on file  . Number of children: Not on file  . Years of education: Not on file  . Highest education level: Not on file  Occupational History  . Not on file  Tobacco Use  . Smoking status: Passive Smoke Exposure -  Never Smoker  . Smokeless tobacco: Never Used  Vaping Use  . Vaping Use: Never used  Substance and Sexual Activity  . Alcohol use: No    Alcohol/week: 0.0 standard drinks  . Drug use: No  . Sexual activity: Not Currently    Birth control/protection: Implant  Other Topics Concern  . Not on file  Social History Narrative   Signa is in the 10th grade at [employer]; she does well in school. She lives with mom and siblings. She does not play any sports.   Social Determinants of Health   Financial Resource Strain: Not on file  Food Insecurity: Not on file  Transportation Needs: Not on file  Physical Activity: Not on file  Stress: Not on file  Social Connections: Not on file  Intimate Partner Violence: Not on file   Family History  Problem Relation Age of Onset  . Hypertension Mother   . Heart attack Maternal Grandmother   . Hypertension Maternal Grandmother   . Hypertension Maternal Grandfather   . Migraines Neg Hx   . Seizures Neg Hx   . Depression Neg Hx   . Anxiety disorder Neg Hx   . Bipolar disorder Neg Hx   . ADD / ADHD Neg Hx   . Autism Neg Hx     OBJECTIVE:  Vitals:   12/27/20 1621  BP: 108/71  Pulse: 71  Resp: 18  Temp: 98 F (36.7 C)  SpO2: 96%  General appearance: alert; appears fatigued, but nontoxic, speaking in full sentences and managing own secretions HEENT: NCAT; Ears: EACs clear, TMs pearly gray with visible cone of light, without erythema; Eyes: PERRL, EOMI grossly; Nose: no obvious rhinorrhea; Throat: oropharynx clear, tonsils 1+ and mildly erythematous without white tonsillar exudates, cobblestoning present, uvula midline; Sinuses: frontal and maxillary sinuses tender to palpation Neck: supple with LAD Lungs: CTA bilaterally without adventitious breath sounds; cough absent Heart: regular rate and rhythm.  Radial pulses 2+ symmetrical bilaterally Skin: warm and dry Psychological: alert and cooperative; normal mood and affect  LABS: No results  found for this or any previous visit (from the past 24 hour(s)).   ASSESSMENT & PLAN:  1. Acute non-recurrent pansinusitis     Meds ordered this encounter  Medications  . amoxicillin-clavulanate (AUGMENTIN) 875-125 MG tablet    Sig: Take 1 tablet by mouth 2 (two) times daily for 7 days.    Dispense:  14 tablet    Refill:  0    Order Specific Question:   Supervising Provider    Answer:   Merrilee Jansky [id number]  . fluconazole (DIFLUCAN) 150 MG tablet    Sig: Take one tablet at the onset of symptoms. If symptoms are still present 3 days later, take the second tablet.    Dispense:  2 tablet    Refill:  0    Order Specific Question:   Supervising Provider    Answer:   Merrilee Jansky [id number]    Acute Sinusitis Push fluids and get rest Prescribed amoxicillin 875mg  twice daily for 7 days. Prescribed fluconazole in case of yeast   Take as directed and to completion.  Drink warm or cool liquids, use throat lozenges, or popsicles to help alleviate symptoms Take OTC ibuprofen or tylenol as needed for pain May use Zyrtec D and flonase to help alleviate symptoms Follow up with PCP if symptoms persist Return or go to ER if you have any new or worsening symptoms such as fever, chills, nausea, vomiting, worsening sore throat, cough, abdominal pain, chest pain, changes in bowel or bladder habits.   Reviewed expectations re: course of current medical issues. Questions answered. Outlined signs and symptoms indicating need for more acute intervention. Patient verbalized understanding. After Visit Summary given.          , NP 12/27/20 1700

## 2020-12-27 NOTE — ED Triage Notes (Signed)
Pt presents with nasal congestion and sinus pressure for over a week

## 2020-12-27 NOTE — Discharge Instructions (Signed)
I have sent in Augmentin for you to take twice a day for 7 days.  May take zyrtec daily  I have sent in fluconazole in case of yeast. Take one tablet at the onset of symptoms. If symptoms are still present in 3 days, take the second tablet.   Follow up with this office or with primary care if symptoms are persisting.  Follow up in the ER for high fever, trouble swallowing, trouble breathing, other concerning symptoms.

## 2021-01-08 ENCOUNTER — Other Ambulatory Visit: Payer: Self-pay

## 2021-01-08 ENCOUNTER — Ambulatory Visit
Admission: RE | Admit: 2021-01-08 | Discharge: 2021-01-08 | Disposition: A | Discharge status: Home / Self Care | Payer: No Typology Code available for payment source | Source: Ambulatory Visit | Attending: Family Medicine | Admitting: Family Medicine

## 2021-01-08 VITALS — BP 121/80 | HR 91 | Temp 98.4°F | Resp 18

## 2021-01-08 DIAGNOSIS — R0981 Nasal congestion: Secondary | ICD-10-CM

## 2021-01-08 MED ORDER — PREDNISONE 20 MG PO TABS
20.0000 mg | ORAL_TABLET | Freq: Every day | ORAL | 0 refills | Status: AC
Start: 2021-01-08 — End: 2021-01-13

## 2021-01-08 NOTE — ED Triage Notes (Signed)
Pt c/o nasal congestion, bilateral ear pain, and loss of smell/taste of over 2wks. States tx'd here last week for a sinus infection. States has one day left of antibiotics but not feeling any better.

## 2021-01-08 NOTE — ED Provider Notes (Signed)
[name]    CSN: [id number] Arrival date & time: 01/08/21  1544      History   Chief Complaint Chief Complaint  Patient presents with  . Nasal Congestion    HPI Natalie Dixon is a 20 y.o. female.   HPI Patient presents today with worsening nasal congestion following treatment of acute sinusitis.  Patient reports overall feeling better much improved from initial symptoms however she continues to have severe congestion involving her naris which is making it difficult to breathe.  She still taking the Zyrtec however is not taking any type of a decongestant and her main complaint is that she feels persistently stuffy.   Past Medical History:  Diagnosis Date  . Acne     Patient Active Problem List   Diagnosis Date Noted  . Tension headache, chronic 08/01/2016  . Surveillance of implantable subdermal contraceptive 12/04/2015  . Menorrhagia with regular cycle 08/30/2015  . Migraine without aura 08/30/2015  . Iron deficiency anemia 08/30/2015    Past Surgical History:  Procedure Laterality Date  . TONSILLECTOMY  2004    OB History   No obstetric history on file.      Home Medications    Prior to Admission medications   Medication Sig Start Date End Date Taking? Authorizing Provider  predniSONE (DELTASONE) 20 MG tablet Take 1 tablet (20 mg total) by mouth daily with breakfast for 5 days. 01/08/21 01/13/21 Yes Bing Neighbors, FNP  etonogestrel (NEXPLANON) 68 MG IMPL implant 1 each by Subdermal route once.    [provider]  fluconazole (DIFLUCAN) 150 MG tablet Take one tablet at the onset of symptoms. If symptoms are still present 3 days later, take the second tablet. 12/27/20   Moshe Cipro, NP  fluocinolone (SYNALAR) 0.01 % external solution Apply topically 2 (two) times daily. 01/10/20   Janalyn Harder, MD  triamcinolone cream (KENALOG) 0.1 % Apply 1 application topically 2 (two) times daily as needed. 05/24/20   Glyn Ade,  PA-C    Family History Family History  Problem Relation Age of Onset  . Hypertension Mother   . Heart attack Maternal Grandmother   . Hypertension Maternal Grandmother   . Hypertension Maternal Grandfather   . Migraines Neg Hx   . Seizures Neg Hx   . Depression Neg Hx   . Anxiety disorder Neg Hx   . Bipolar disorder Neg Hx   . ADD / ADHD Neg Hx   . Autism Neg Hx     Social History Social History   Tobacco Use  . Smoking status: Passive Smoke Exposure - Never Smoker  . Smokeless tobacco: Never Used  Vaping Use  . Vaping Use: Never used  Substance Use Topics  . Alcohol use: No    Alcohol/week: 0.0 standard drinks  . Drug use: No     Allergies   Patient has no known allergies.   Review of Systems Review of Systems Pertinent negatives listed in HPI   Physical Exam Triage Vital Signs ED Triage Vitals [01/08/21 1608]  Enc Vitals Group     BP 121/80     Pulse Rate 91     Resp 18     Temp 98.4 F (36.9 C)     Temp Source Oral     SpO2 98 %     Weight      Height      Head Circumference      Peak Flow      Pain Score 7  Pain Loc      Pain Edu?      Excl. in GC?    No data found.  Updated Vital Signs BP 121/80 (BP Location: Left Arm)   Pulse 91   Temp 98.4 F (36.9 C) (Oral)   Resp 18   SpO2 98%   Visual Acuity Right Eye Distance:   Left Eye Distance:   Bilateral Distance:    Right Eye Near:   Left Eye Near:    Bilateral Near:     Physical Exam  General Appearance:    Alert, cooperative, no distress  HENT:   Normocephalic, ears normal, nares mucosal edema with occlusion of the right turbinate left turbinate mildly patent with congestion,  oropharynx clear   Eyes:    PERRL, conjunctiva/corneas clear, EOM's intact       Lungs:     Clear to auscultation bilaterally, respirations unlabored  Heart:    Regular rate and rhythm  Neurologic:   Awake, alert, oriented x 3. No apparent focal neurological           defect.      UC Treatments /  Results  Labs (all labs ordered are listed, but only abnormal results are displayed) Labs Reviewed - No data to display  EKG   Radiology No results found.  Procedures Procedures (including critical care time)  Medications Ordered in UC Medications - No data to display  Initial Impression / Assessment and Plan / UC Course  I have reviewed the triage vital signs and the nursing notes.  Pertinent labs & imaging results that were available during my care of the patient were reviewed by me and considered in my medical decision making (see chart for details).     Treating her symptoms of nasal congestion with prednisone 20 mg once daily for 5 days.  Patient has significant occlusion of the right turbinate with mild patency of the left.  Recommended OTC Sudafed for intermittent management of nasal congestion however given the diffuse nature of current congestive symptoms prednisone is indicated.  Patient advised to complete any remaining doses of oral antibiotics.  PCP follow-up if symptoms do not completely resolve. Final Clinical Impressions(s) / UC Diagnoses   Final diagnoses:  Nasal congestion   Discharge Instructions   None    ED Prescriptions    Medication Sig Dispense Auth. Provider   predniSONE (DELTASONE) 20 MG tablet Take 1 tablet (20 mg total) by mouth daily with breakfast for 5 days. 5 tablet Bing Neighbors, FNP     PDMP not reviewed this encounter.   Bing Neighbors, FNP 01/08/21 [phone]

## 2021-01-10 ENCOUNTER — Emergency Department (HOSPITAL_BASED_OUTPATIENT_CLINIC_OR_DEPARTMENT_OTHER)
Admission: EM | Admit: 2021-01-10 | Discharge: 2021-01-10 | Disposition: A | Discharge status: Home / Self Care | Payer: No Typology Code available for payment source | Attending: Emergency Medicine | Admitting: Emergency Medicine

## 2021-01-10 ENCOUNTER — Encounter (HOSPITAL_BASED_OUTPATIENT_CLINIC_OR_DEPARTMENT_OTHER): Payer: Self-pay

## 2021-01-10 ENCOUNTER — Other Ambulatory Visit: Payer: Self-pay

## 2021-01-10 DIAGNOSIS — Z7722 Contact with and (suspected) exposure to environmental tobacco smoke (acute) (chronic): Secondary | ICD-10-CM | POA: Diagnosis not present

## 2021-01-10 DIAGNOSIS — R000 Tachycardia, unspecified: Secondary | ICD-10-CM | POA: Insufficient documentation

## 2021-01-10 DIAGNOSIS — R197 Diarrhea, unspecified: Secondary | ICD-10-CM | POA: Diagnosis not present

## 2021-01-10 DIAGNOSIS — R112 Nausea with vomiting, unspecified: Secondary | ICD-10-CM | POA: Insufficient documentation

## 2021-01-10 LAB — PREGNANCY, URINE: Preg Test, Ur: NEGATIVE

## 2021-01-10 MED ORDER — ONDANSETRON 4 MG PO TBDP: 4.0000 mg | ORAL_TABLET | Freq: Three times a day (TID) | ORAL | 0 refills | Status: DC | PRN

## 2021-01-10 MED ORDER — ONDANSETRON 4 MG PO TBDP
4.0000 mg | ORAL_TABLET | Freq: Once | ORAL | Status: AC
  Administered 2021-01-10: 4 mg via ORAL
  Filled 2021-01-10: qty 1

## 2021-01-10 NOTE — ED Provider Notes (Signed)
[facility] EMERGENCY DEPARTMENT Provider Note   CSN: [id number] Arrival date & time: 01/10/21  0136     History Chief Complaint  Patient presents with  . Emesis    Natalie Dixon is a 20 y.o. female.  HPI     This is a 20 year old female who presents with nausea, vomiting, and diarrhea.  Patient reports "I think I have food poisoning."  Patient reports onset of symptoms yesterday afternoon after eating [employer].  She reports multiple episodes of nonbilious, nonbloody emesis and diarrhea.  She reports ongoing nausea.  No real abdominal pain.  She denies any fevers or chills.  No known sick contacts or Covid exposures.  She is vaccinated.  She is not taking anything for her symptoms.  Denies chest pain, shortness of breath, cough, congestion.  Does not believe herself to be pregnant.  She is on birth control.  Past Medical History:  Diagnosis Date  . Acne     Patient Active Problem List   Diagnosis Date Noted  . Tension headache, chronic 08/01/2016  . Surveillance of implantable subdermal contraceptive 12/04/2015  . Menorrhagia with regular cycle 08/30/2015  . Migraine without aura 08/30/2015  . Iron deficiency anemia 08/30/2015    Past Surgical History:  Procedure Laterality Date  . TONSILLECTOMY  2004     OB History   No obstetric history on file.     Family History  Problem Relation Age of Onset  . Hypertension Mother   . Heart attack Maternal Grandmother   . Hypertension Maternal Grandmother   . Hypertension Maternal Grandfather   . Migraines Neg Hx   . Seizures Neg Hx   . Depression Neg Hx   . Anxiety disorder Neg Hx   . Bipolar disorder Neg Hx   . ADD / ADHD Neg Hx   . Autism Neg Hx     Social History   Tobacco Use  . Smoking status: Passive Smoke Exposure - Never Smoker  . Smokeless tobacco: Never Used  Vaping Use  . Vaping Use: Never used  Substance Use Topics  . Alcohol use: No    Alcohol/week: 0.0 standard drinks  . Drug  use: No    Home Medications Prior to Admission medications   Medication Sig Start Date End Date Taking? Authorizing Provider  ondansetron (ZOFRAN ODT) 4 MG disintegrating tablet Take 1 tablet (4 mg total) by mouth every 8 (eight) hours as needed. 01/10/21  Yes Joselynn Amoroso, Mayer Masker, MD  etonogestrel (NEXPLANON) 68 MG IMPL implant 1 each by Subdermal route once.    [provider]  fluconazole (DIFLUCAN) 150 MG tablet Take one tablet at the onset of symptoms. If symptoms are still present 3 days later, take the second tablet. 12/27/20   Moshe Cipro, NP  fluocinolone (SYNALAR) 0.01 % external solution Apply topically 2 (two) times daily. 01/10/20   Janalyn Harder, MD  predniSONE (DELTASONE) 20 MG tablet Take 1 tablet (20 mg total) by mouth daily with breakfast for 5 days. 01/08/21 01/13/21  Bing Neighbors, FNP  triamcinolone cream (KENALOG) 0.1 % Apply 1 application topically 2 (two) times daily as needed. 05/24/20   Glyn Ade, PA-C    Allergies    Patient has no known allergies.  Review of Systems   Review of Systems  Constitutional: Negative for fever.  Respiratory: Negative for shortness of breath.   Cardiovascular: Negative for chest pain.  Gastrointestinal: Positive for diarrhea, nausea and vomiting. Negative for abdominal pain.  Genitourinary: Negative for dysuria.  All other systems reviewed and are negative.   Physical Exam Updated Vital Signs BP 125/77 (BP Location: Right Arm)   Pulse (!) 124   Temp 98.6 F (37 C) (Oral)   Resp 16   Ht 1.549 m (5\' 1" )   Wt 49.9 kg   LMP 11/22/2020   SpO2 100%   BMI 20.78 kg/m   Physical Exam Vitals and nursing note reviewed.  Constitutional:      Appearance: She is well-developed. She is not ill-appearing.  HENT:     Head: Normocephalic and atraumatic.     Mouth/Throat:     Mouth: Mucous membranes are moist.  Eyes:     Pupils: Pupils are equal, round, and reactive to light.  Cardiovascular:     Rate and  Rhythm: Regular rhythm. Tachycardia present.     Heart sounds: Normal heart sounds.  Pulmonary:     Effort: Pulmonary effort is normal. No respiratory distress.     Breath sounds: No wheezing.  Abdominal:     General: Bowel sounds are normal.     Palpations: Abdomen is soft.     Tenderness: There is no abdominal tenderness. There is no guarding or rebound.  Musculoskeletal:     Cervical back: Neck supple.     Right lower leg: No edema.     Left lower leg: No edema.  Skin:    General: Skin is warm and dry.  Neurological:     Mental Status: She is alert and oriented to person, place, and time.  Psychiatric:        Mood and Affect: Mood normal.     ED Results / Procedures / Treatments   Labs (all labs ordered are listed, but only abnormal results are displayed) Labs Reviewed  PREGNANCY, URINE    EKG None  Radiology No results found.  Procedures Procedures   Medications Ordered in ED Medications  ondansetron (ZOFRAN-ODT) disintegrating tablet 4 mg (4 mg Oral Given 01/10/21 0209)    ED Course  I have reviewed the triage vital signs and the nursing notes.  Pertinent labs & imaging results that were available during my care of the patient were reviewed by me and considered in my medical decision making (see chart for details).    MDM Rules/Calculators/A&P                          Patient presents with nausea, vomiting, diarrhea.  She is nontoxic-appearing.  She is tachycardic but does not appear significantly dehydrated at this time.  She has no abdominal tenderness which argues against acute abdominal pathology such as appendicitis or cholecystitis.  Higher suspicion for viral etiology or foodborne pathogen.  Urine pregnancy is negative.  Patient was given Zofran.  After Zofran, she is able to orally hydrate.  On my repeat evaluation she states she feels much better.  Still slightly tachycardic at 105 but significantly improved.  Will discharge home with Zofran and  supportive measures  After history, exam, and medical workup I feel the patient has been appropriately medically screened and is safe for discharge home. Pertinent diagnoses were discussed with the patient. Patient was given return precautions.  Final Clinical Impression(s) / ED Diagnoses Final diagnoses:  Nausea vomiting and diarrhea    Rx / DC Orders ED Discharge Orders         Ordered    ondansetron (ZOFRAN ODT) 4 MG disintegrating tablet  Every 8 hours PRN  01/10/21 0409           Shon Baton, MD 01/10/21 [phone]

## 2021-01-10 NOTE — Discharge Instructions (Addendum)
You were seen today for nausea and vomiting.  This could be a viral illness versus food poisoning.  Take Zofran as needed.  Make sure that you are staying hydrated.

## 2021-01-10 NOTE — ED Notes (Signed)
Ginger ale given

## 2021-01-10 NOTE — ED Triage Notes (Signed)
Pt presents with n/v/d, abd pain and headache since 9pm last night. Says this started after having [employer] for dinner.

## 2021-01-13 ENCOUNTER — Ambulatory Visit: Payer: Self-pay

## 2021-01-13 ENCOUNTER — Encounter: Payer: Self-pay | Admitting: Emergency Medicine

## 2021-01-13 ENCOUNTER — Other Ambulatory Visit: Payer: Self-pay

## 2021-01-13 ENCOUNTER — Ambulatory Visit
Admission: EM | Admit: 2021-01-13 | Discharge: 2021-01-13 | Disposition: A | Discharge status: Home / Self Care | Payer: No Typology Code available for payment source | Attending: Urgent Care | Admitting: Urgent Care

## 2021-01-13 DIAGNOSIS — R197 Diarrhea, unspecified: Secondary | ICD-10-CM

## 2021-01-13 DIAGNOSIS — R112 Nausea with vomiting, unspecified: Secondary | ICD-10-CM

## 2021-01-13 DIAGNOSIS — K529 Noninfective gastroenteritis and colitis, unspecified: Secondary | ICD-10-CM | POA: Diagnosis not present

## 2021-01-13 MED ORDER — LOPERAMIDE HCL 2 MG PO CAPS: 2.0000 mg | ORAL_CAPSULE | Freq: Two times a day (BID) | ORAL | 0 refills | Status: DC | PRN

## 2021-01-13 NOTE — ED Provider Notes (Signed)
[facility]   MRN: [id number] DOB: Apr 04, 2001  Subjective:   Natalie Dixon is a 20 y.o. female presenting for 4-day history of persistent diarrhea.  Patient was already seen through the emergency room, states that she had fever, nausea, vomiting and abdominal pain with her diarrhea initially.  However everything has improved except for an upset stomach, diarrhea.  Patient has been using Zofran with good success except for the diarrhea.  She has been able to drink fluids, hold foods down.  Wants to make sure that she is not severely dehydrated and see if she could get a medication for the diarrhea.  Denies any bloody stools, recent hospitalizations, recent antibiotic use, long distance travel, history of ulcerative colitis, diverticulitis or any other chronic GI issue.  No current facility-administered medications for this encounter.  Current Outpatient Medications:  .  etonogestrel (NEXPLANON) 68 MG IMPL implant, 1 each by Subdermal route once., Disp: , Rfl:  .  fluconazole (DIFLUCAN) 150 MG tablet, Take one tablet at the onset of symptoms. If symptoms are still present 3 days later, take the second tablet., Disp: 2 tablet, Rfl: 0 .  fluocinolone (SYNALAR) 0.01 % external solution, Apply topically 2 (two) times daily., Disp: 60 mL, Rfl: 3 .  ondansetron (ZOFRAN ODT) 4 MG disintegrating tablet, Take 1 tablet (4 mg total) by mouth every 8 (eight) hours as needed., Disp: 20 tablet, Rfl: 0 .  predniSONE (DELTASONE) 20 MG tablet, Take 1 tablet (20 mg total) by mouth daily with breakfast for 5 days., Disp: 5 tablet, Rfl: 0 .  triamcinolone cream (KENALOG) 0.1 %, Apply 1 application topically 2 (two) times daily as needed., Disp: 80 g, Rfl: 2   No Known Allergies  Past Medical History:  Diagnosis Date  . Acne      Past Surgical History:  Procedure Laterality Date  . TONSILLECTOMY  2004    Family History  Problem Relation Age of Onset  . Hypertension Mother   . Heart  attack Maternal Grandmother   . Hypertension Maternal Grandmother   . Hypertension Maternal Grandfather   . Migraines Neg Hx   . Seizures Neg Hx   . Depression Neg Hx   . Anxiety disorder Neg Hx   . Bipolar disorder Neg Hx   . ADD / ADHD Neg Hx   . Autism Neg Hx     Social History   Tobacco Use  . Smoking status: Passive Smoke Exposure - Never Smoker  . Smokeless tobacco: Never Used  Vaping Use  . Vaping Use: Never used  Substance Use Topics  . Alcohol use: No    Alcohol/week: 0.0 standard drinks  . Drug use: No    ROS   Objective:   Vitals: BP 128/71 (BP Location: Left Arm)   Pulse 78   Resp 18   SpO2 98%   Physical Exam Constitutional:      General: She is not in acute distress.    Appearance: Normal appearance. She is well-developed and normal weight. She is not ill-appearing, toxic-appearing or diaphoretic.  HENT:     Head: Normocephalic and atraumatic.     Right Ear: External ear normal.     Left Ear: External ear normal.     Nose: Nose normal.     Mouth/Throat:     Mouth: Mucous membranes are moist.     Pharynx: Oropharynx is clear.  Eyes:     General: No scleral icterus.    Extraocular Movements: Extraocular movements intact.  Pupils: Pupils are equal, round, and reactive to light.  Cardiovascular:     Rate and Rhythm: Normal rate and regular rhythm.     Heart sounds: Normal heart sounds. No murmur heard. No friction rub. No gallop.   Pulmonary:     Effort: Pulmonary effort is normal. No respiratory distress.     Breath sounds: Normal breath sounds. No stridor. No wheezing, rhonchi or rales.  Abdominal:     General: There is no distension.     Palpations: Abdomen is soft. There is no mass.     Tenderness: There is no abdominal tenderness. There is no right CVA tenderness, left CVA tenderness, guarding or rebound.     Comments: Hyperactive bowel sounds.  Skin:    General: Skin is warm and dry.     Coloration: Skin is not pale.     Findings:  No rash.  Neurological:     General: No focal deficit present.     Mental Status: She is alert and oriented to person, place, and time.  Psychiatric:        Mood and Affect: Mood normal.        Behavior: Behavior normal.        Thought Content: Thought content normal.        Judgment: Judgment normal.       Assessment and Plan :   PDMP not reviewed this encounter.  1. Gastroenteritis   2. Nausea vomiting and diarrhea     Will manage for suspected viral gastroenteritis with supportive care.  Recommended patient hydrate well, eat light meals and maintain electrolytes.  Will use Zofran and Imodium for nausea, vomiting and diarrhea. Counseled patient on potential for adverse effects with medications prescribed/recommended today, ER and return-to-clinic precautions discussed, patient verbalized understanding.    Wallis Bamberg, [place] 01/13/21 1514

## 2021-01-13 NOTE — ED Triage Notes (Signed)
Pt states that she has diarrhea X1 daily, HA and feels dyhrated. Pt states that she can tolerate foods and liquids.

## 2021-03-11 ENCOUNTER — Ambulatory Visit: Payer: Self-pay

## 2021-03-16 ENCOUNTER — Other Ambulatory Visit: Payer: Self-pay

## 2021-03-16 ENCOUNTER — Ambulatory Visit
Admission: RE | Admit: 2021-03-16 | Discharge: 2021-03-16 | Disposition: A | Discharge status: Home / Self Care | Payer: No Typology Code available for payment source | Source: Ambulatory Visit | Attending: Urgent Care | Admitting: Urgent Care

## 2021-03-16 VITALS — BP 101/66 | HR 98 | Temp 98.6°F | Resp 16

## 2021-03-16 DIAGNOSIS — J0101 Acute recurrent maxillary sinusitis: Secondary | ICD-10-CM

## 2021-03-16 DIAGNOSIS — J3089 Other allergic rhinitis: Secondary | ICD-10-CM

## 2021-03-16 DIAGNOSIS — R519 Headache, unspecified: Secondary | ICD-10-CM

## 2021-03-16 MED ORDER — DOXYCYCLINE HYCLATE 100 MG PO CAPS: 100.0000 mg | ORAL_CAPSULE | Freq: Two times a day (BID) | ORAL | 0 refills | Status: DC

## 2021-03-16 MED ORDER — PREDNISONE 20 MG PO TABS: ORAL_TABLET | ORAL | 0 refills | Status: DC

## 2021-03-16 MED ORDER — PSEUDOEPHEDRINE HCL 30 MG PO TABS: 30.0000 mg | ORAL_TABLET | Freq: Three times a day (TID) | ORAL | 0 refills | Status: DC | PRN

## 2021-03-16 MED ORDER — MONTELUKAST SODIUM 10 MG PO TABS: 10.0000 mg | ORAL_TABLET | Freq: Every day | ORAL | 0 refills | Status: DC

## 2021-03-16 MED ORDER — FLUTICASONE PROPIONATE 50 MCG/ACT NA SUSP: 2.0000 | Freq: Every day | NASAL | 0 refills | Status: DC

## 2021-03-16 MED ORDER — FLUCONAZOLE 150 MG PO TABS: 150.0000 mg | ORAL_TABLET | ORAL | 0 refills | Status: DC

## 2021-03-16 NOTE — ED Provider Notes (Signed)
[facility]   MRN: [id number] DOB: Dec 21, 2000  Subjective:   Natalie Dixon is a 20 y.o. female presenting for 1 week history of persistent and recurrent severe sinus congestion, swelling of her face with pain.  Patient reports that she has had bilateral intermittent ear pain.  Has used allergy medicines intermittently.  Unfortunately has had significant difficulty controlling her allergies this past year.  She did take a course of Augmentin and steroids this past spring.  She did get tested for COVID-19 and was negative.  Does not want to be tested again today.  No current facility-administered medications for this encounter.  Current Outpatient Medications:  .  etonogestrel (NEXPLANON) 68 MG IMPL implant, 1 each by Subdermal route once., Disp: , Rfl:  .  fluconazole (DIFLUCAN) 150 MG tablet, Take one tablet at the onset of symptoms. If symptoms are still present 3 days later, take the second tablet., Disp: 2 tablet, Rfl: 0 .  fluocinolone (SYNALAR) 0.01 % external solution, Apply topically 2 (two) times daily., Disp: 60 mL, Rfl: 3 .  loperamide (IMODIUM) 2 MG capsule, Take 1 capsule (2 mg total) by mouth 2 (two) times daily as needed for diarrhea or loose stools., Disp: 14 capsule, Rfl: 0 .  ondansetron (ZOFRAN ODT) 4 MG disintegrating tablet, Take 1 tablet (4 mg total) by mouth every 8 (eight) hours as needed., Disp: 20 tablet, Rfl: 0 .  triamcinolone cream (KENALOG) 0.1 %, Apply 1 application topically 2 (two) times daily as needed., Disp: 80 g, Rfl: 2   No Known Allergies  Past Medical History:  Diagnosis Date  . Acne      Past Surgical History:  Procedure Laterality Date  . TONSILLECTOMY  2004    Family History  Problem Relation Age of Onset  . Hypertension Mother   . Heart attack Maternal Grandmother   . Hypertension Maternal Grandmother   . Hypertension Maternal Grandfather   . Migraines Neg Hx   . Seizures Neg Hx   . Depression Neg Hx   . Anxiety  disorder Neg Hx   . Bipolar disorder Neg Hx   . ADD / ADHD Neg Hx   . Autism Neg Hx     Social History   Tobacco Use  . Smoking status: Passive Smoke Exposure - Never Smoker  . Smokeless tobacco: Never Used  Vaping Use  . Vaping Use: Never used  Substance Use Topics  . Alcohol use: No    Alcohol/week: 0.0 standard drinks  . Drug use: No    ROS   Objective:   Vitals: BP 101/66 (BP Location: Right Arm)   Pulse 98   Temp 98.6 F (37 C) (Oral)   Resp 16   LMP 03/13/2021 (Exact Date)   SpO2 97%   Physical Exam Constitutional:      General: She is not in acute distress.    Appearance: Normal appearance. She is well-developed. She is not ill-appearing, toxic-appearing or diaphoretic.  HENT:     Head: Normocephalic and atraumatic.     Right Ear: Tympanic membrane and ear canal normal. No drainage or tenderness. No middle ear effusion. Tympanic membrane is not erythematous.     Left Ear: Tympanic membrane and ear canal normal. No drainage or tenderness.  No middle ear effusion. Tympanic membrane is not erythematous.     Nose: Nose normal. No congestion or rhinorrhea.     Mouth/Throat:     Mouth: Mucous membranes are moist. No oral lesions.     Pharynx:  Oropharynx is clear. No pharyngeal swelling, oropharyngeal exudate, posterior oropharyngeal erythema or uvula swelling.     Tonsils: No tonsillar exudate or tonsillar abscesses.  Eyes:     Extraocular Movements: Extraocular movements intact.     Right eye: Normal extraocular motion.     Left eye: Normal extraocular motion.     Conjunctiva/sclera: Conjunctivae normal.     Pupils: Pupils are equal, round, and reactive to light.  Cardiovascular:     Rate and Rhythm: Normal rate and regular rhythm.     Pulses: Normal pulses.     Heart sounds: Normal heart sounds. No murmur heard. No friction rub. No gallop.   Pulmonary:     Effort: Pulmonary effort is normal. No respiratory distress.     Breath sounds: Normal breath  sounds. No stridor. No wheezing, rhonchi or rales.  Musculoskeletal:     Cervical back: Normal range of motion and neck supple.  Lymphadenopathy:     Cervical: No cervical adenopathy.  Skin:    General: Skin is warm and dry.     Findings: No rash.  Neurological:     General: No focal deficit present.     Mental Status: She is alert and oriented to person, place, and time.  Psychiatric:        Mood and Affect: Mood normal.        Behavior: Behavior normal.        Thought Content: Thought content normal.       Assessment and Plan :   PDMP not reviewed this encounter.  1. Recurrent maxillary sinusitis   2. Facial pain   3. Allergic rhinitis due to other allergic trigger, unspecified seasonality     Will start empiric treatment for sinusitis with doxycycline since she has taken Augmentin already in the past couple of months.  Given her significant allergies, will use an oral prednisone course as well.  Recommended supportive care otherwise including the use of oral antihistamine, restart Flonase and decongestant following the prednisone course. Counseled patient on potential for adverse effects with medications prescribed/recommended today, ER and return-to-clinic precautions discussed, patient verbalized understanding.    Wallis Bamberg, PA-C 03/16/21 1455

## 2021-03-16 NOTE — Discharge Instructions (Addendum)
Once you finish prednisone, start the nasal steroid daily. You can also use Sudafed (pseudoephedrine) then as well as needed not necessarily daily. Start Singulair now, keep taking Xyzal.

## 2021-03-16 NOTE — ED Triage Notes (Signed)
Pt said has severe allergies and for a few days has had sinus pressure and swelling in her face, with yellowish green mucus in her nose. Pt said no fevers, no chills tested negative for covid

## 2021-04-09 DIAGNOSIS — H5213 Myopia, bilateral: Secondary | ICD-10-CM | POA: Diagnosis not present

## 2021-05-12 ENCOUNTER — Encounter: Payer: Self-pay | Admitting: Dermatology

## 2021-05-12 NOTE — Progress Notes (Signed)
   Follow-Up Visit   Subjective  Natalie Dixon is a 20 y.o. female who presents for the following: Acne (31 day follow up).  Acne follow-up Location:  Duration:  Quality:  Associated Signs/Symptoms: Modifying Factors:  Severity:  Timing: Context:   Objective  Well appearing patient in no apparent distress; mood and affect are within normal limits. Head - Anterior (Face) Deep inflammatory papules clear.  Some PIH.  Moderate cheilitis.  Normal mood.  Compliant with I pledge.    A focused examination was performed including head and neck. Relevant physical exam findings are noted in the Assessment and Plan.   Assessment & Plan    Acne vulgaris Head - Anterior (Face)  Continue isotretinoin.  Maintain I pledge compliance.  She knows she can call me if there are any problems.  Related Procedures Pregnancy, urine  Related Medications hydrocortisone 2.5 % lotion Apply topically 2 (two) times daily.  ISOtretinoin (CLARAVIS) 30 MG capsule Take 1 capsule (30 mg total) by mouth daily.      I, Janalyn Harder, MD, have reviewed all documentation for this visit.  The documentation on 05/12/21 for the exam, diagnosis, procedures, and orders are all accurate and complete.

## 2021-05-17 DIAGNOSIS — Z113 Encounter for screening for infections with a predominantly sexual mode of transmission: Secondary | ICD-10-CM | POA: Diagnosis not present

## 2021-06-14 DIAGNOSIS — Z3046 Encounter for surveillance of implantable subdermal contraceptive: Secondary | ICD-10-CM | POA: Diagnosis not present

## 2021-07-08 DIAGNOSIS — S134XXA Sprain of ligaments of cervical spine, initial encounter: Secondary | ICD-10-CM | POA: Diagnosis not present

## 2021-07-08 DIAGNOSIS — M25559 Pain in unspecified hip: Secondary | ICD-10-CM | POA: Diagnosis not present

## 2021-09-22 ENCOUNTER — Ambulatory Visit
Admission: EM | Admit: 2021-09-22 | Discharge: 2021-09-22 | Disposition: A | Discharge status: Home / Self Care | Payer: No Typology Code available for payment source | Attending: Physician Assistant | Admitting: Physician Assistant

## 2021-09-22 ENCOUNTER — Encounter: Payer: Self-pay | Admitting: *Deleted

## 2021-09-22 ENCOUNTER — Other Ambulatory Visit: Payer: Self-pay

## 2021-09-22 DIAGNOSIS — R52 Pain, unspecified: Secondary | ICD-10-CM

## 2021-09-22 DIAGNOSIS — J029 Acute pharyngitis, unspecified: Secondary | ICD-10-CM | POA: Diagnosis present

## 2021-09-22 LAB — POCT RAPID STREP A (OFFICE): Rapid Strep A Screen: NEGATIVE

## 2021-09-22 LAB — POCT INFLUENZA A/B
Influenza A, POC: NEGATIVE
Influenza B, POC: NEGATIVE

## 2021-09-22 NOTE — ED Provider Notes (Signed)
[name]    CSN: [id number] Arrival date & time: 09/22/21  4128      History   Chief Complaint Chief Complaint  Patient presents with   Headache   Sore Throat    HPI Natalie Dixon is a 20 y.o. female.   Patient here today for evaluation of sore throat and runny nose for 2 days ago.  She states last night she also started to develop body aches and headaches.  She has not had known fever at home but is running a low-grade fever in office today.  She has tried over-the-counter medication without significant relief.  The history is provided by the patient.  Headache Associated symptoms: congestion, cough, fever and sore throat   Associated symptoms: no abdominal pain, no diarrhea, no ear pain, no nausea and no vomiting   Sore Throat Associated symptoms include headaches. Pertinent negatives include no abdominal pain and no shortness of breath.   Past Medical History:  Diagnosis Date   Acne     Patient Active Problem List   Diagnosis Date Noted   Tension headache, chronic 08/01/2016   Surveillance of implantable subdermal contraceptive 12/04/2015   Menorrhagia with regular cycle 08/30/2015   Migraine without aura 08/30/2015   Iron deficiency anemia 08/30/2015    Past Surgical History:  Procedure Laterality Date   TONSILLECTOMY  2004    OB History   No obstetric history on file.      Home Medications    Prior to Admission medications   Medication Sig Start Date End Date Taking? Authorizing Provider  medroxyPROGESTERone Acetate (DEPO-PROVERA IM) Inject into the muscle.   Yes [provider]  doxycycline (VIBRAMYCIN) 100 MG capsule Take 1 capsule (100 mg total) by mouth 2 (two) times daily. 03/16/21   Wallis Bamberg, PA-C  etonogestrel (NEXPLANON) 68 MG IMPL implant 1 each by Subdermal route once.    [provider]  fluconazole (DIFLUCAN) 150 MG tablet Take 1 tablet (150 mg total) by mouth once a week. 03/16/21   Wallis Bamberg, PA-C   fluocinolone (SYNALAR) 0.01 % external solution Apply topically 2 (two) times daily. 01/10/20   Janalyn Harder, MD  fluticasone (FLONASE) 50 MCG/ACT nasal spray Place 2 sprays into both nostrils daily. 03/16/21   Wallis Bamberg, PA-C  loperamide (IMODIUM) 2 MG capsule Take 1 capsule (2 mg total) by mouth 2 (two) times daily as needed for diarrhea or loose stools. 01/13/21   Wallis Bamberg, PA-C  montelukast (SINGULAIR) 10 MG tablet Take 1 tablet (10 mg total) by mouth at bedtime. 03/16/21   Wallis Bamberg, PA-C  ondansetron (ZOFRAN ODT) 4 MG disintegrating tablet Take 1 tablet (4 mg total) by mouth every 8 (eight) hours as needed. 01/10/21   Horton, Mayer Masker, MD  predniSONE (DELTASONE) 20 MG tablet Take 2 tablets daily with breakfast. 03/16/21   Wallis Bamberg, PA-C  pseudoephedrine (SUDAFED) 30 MG tablet Take 1 tablet (30 mg total) by mouth every 8 (eight) hours as needed for congestion. 03/16/21   Wallis Bamberg, PA-C  triamcinolone cream (KENALOG) 0.1 % Apply 1 application topically 2 (two) times daily as needed. 05/24/20   Glyn Ade, PA-C    Family History Family History  Problem Relation Age of Onset   Hypertension Mother    Heart attack Maternal Grandmother    Hypertension Maternal Grandmother    Hypertension Maternal Grandfather    Migraines Neg Hx    Seizures Neg Hx    Depression Neg Hx    Anxiety  disorder Neg Hx    Bipolar disorder Neg Hx    ADD / ADHD Neg Hx    Autism Neg Hx     Social History Social History   Tobacco Use   Smoking status: Never    Passive exposure: Yes   Smokeless tobacco: Never  Vaping Use   Vaping Use: Never used  Substance Use Topics   Alcohol use: No    Alcohol/week: 0.0 standard drinks   Drug use: No     Allergies   Patient has no known allergies.   Review of Systems Review of Systems  Constitutional:  Positive for fever.  HENT:  Positive for congestion and sore throat. Negative for ear pain.   Eyes:  Negative for discharge and redness.   Respiratory:  Positive for cough. Negative for shortness of breath and wheezing.   Gastrointestinal:  Negative for abdominal pain, diarrhea, nausea and vomiting.  Neurological:  Positive for headaches.    Physical Exam Triage Vital Signs ED Triage Vitals  Enc Vitals Group     BP 09/22/21 0830 120/77     Pulse Rate 09/22/21 0830 (!) 112     Resp 09/22/21 0830 16     Temp 09/22/21 0830 99.9 F (37.7 C)     Temp Source 09/22/21 0830 Oral     SpO2 09/22/21 0830 99 %     Weight --      Height --      Head Circumference --      Peak Flow --      Pain Score 09/22/21 0831 10     Pain Loc --      Pain Edu? --      Excl. in GC? --    No data found.  Updated Vital Signs BP 120/77   Pulse (!) 112   Temp 99.9 F (37.7 C) (Oral)   Resp 16   SpO2 99%      Physical Exam Vitals and nursing note reviewed.  Constitutional:      General: She is not in acute distress.    Appearance: Normal appearance. She is not ill-appearing.  HENT:     Head: Normocephalic and atraumatic.     Right Ear: Tympanic membrane normal.     Left Ear: Tympanic membrane normal.     Nose: Congestion present.     Mouth/Throat:     Mouth: Mucous membranes are moist.     Pharynx: No oropharyngeal exudate or posterior oropharyngeal erythema.  Eyes:     Conjunctiva/sclera: Conjunctivae normal.  Cardiovascular:     Rate and Rhythm: Normal rate and regular rhythm.     Heart sounds: Normal heart sounds. No murmur heard. Pulmonary:     Effort: Pulmonary effort is normal. No respiratory distress.     Breath sounds: Normal breath sounds. No wheezing, rhonchi or rales.  Skin:    General: Skin is warm and dry.  Neurological:     Mental Status: She is alert.  Psychiatric:        Mood and Affect: Mood normal.        Thought Content: Thought content normal.     UC Treatments / Results  Labs (all labs ordered are listed, but only abnormal results are displayed) Labs Reviewed  COVID-19, FLU A+B NAA  CULTURE,  GROUP A STREP [facility]  POCT INFLUENZA A/B  POCT RAPID STREP A (OFFICE)    EKG   Radiology No results found.  Procedures Procedures (including critical care time)  Medications Ordered  in UC Medications - No data to display  Initial Impression / Assessment and Plan / UC Course  I have reviewed the triage vital signs and the nursing notes.  Pertinent labs & imaging results that were available during my care of the patient were reviewed by me and considered in my medical decision making (see chart for details).    Suspect viral etiology of symptoms.  Strep test negative in office.  Will order flu and COVID PCR as well as throat culture.  Recommend symptomatic treatment in the meantime.  Encouraged follow-up with any further concerns.  Final Clinical Impressions(s) / UC Diagnoses   Final diagnoses:  Body aches  Sore throat   Discharge Instructions   None    ED Prescriptions   None    PDMP not reviewed this encounter.   Francene Finders, PA-C 09/22/21 [phone]

## 2021-09-22 NOTE — ED Triage Notes (Signed)
C/O starting with sore throat and runny nose 2 days ago; last night started with body aches and HA.  No known fevers.

## 2021-09-23 LAB — COVID-19, FLU A+B NAA
Influenza A, NAA: DETECTED — AB
Influenza B, NAA: NOT DETECTED
SARS-CoV-2, NAA: NOT DETECTED

## 2021-09-24 LAB — CULTURE, GROUP A STREP (THRC)

## 2021-11-05 IMAGING — CT CT RENAL STONE PROTOCOL
2 of 4 series · 16 of 46 positions shown, 18 images · non-contrast
Comparison: None.

CLINICAL DATA: Right-sided flank pain

EXAM:
CT ABDOMEN AND PELVIS WITHOUT CONTRAST
TECHNIQUE: Multidetector CT imaging of the abdomen and pelvis was performed
following the standard protocol without IV contrast.

[Series 3: axial st · axial · 0.61mm/px · z∈[-281,+94]mm · 13 of 83 slices shown, 15 images]
[im 4/83  soft-tissue]
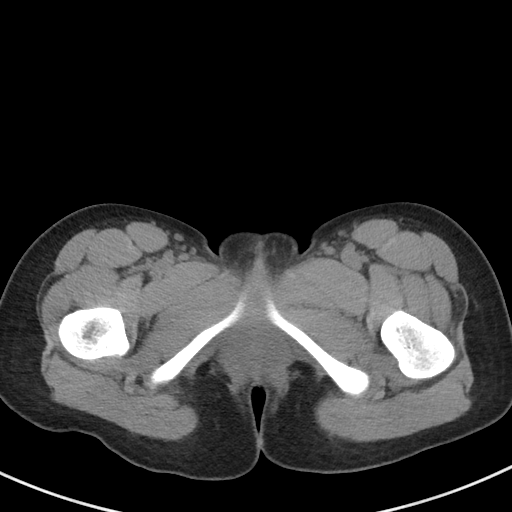
[im 4/83  bone]
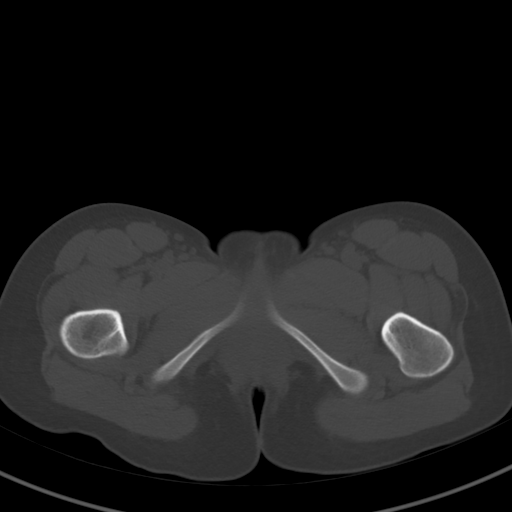
[im 10/83  soft-tissue]
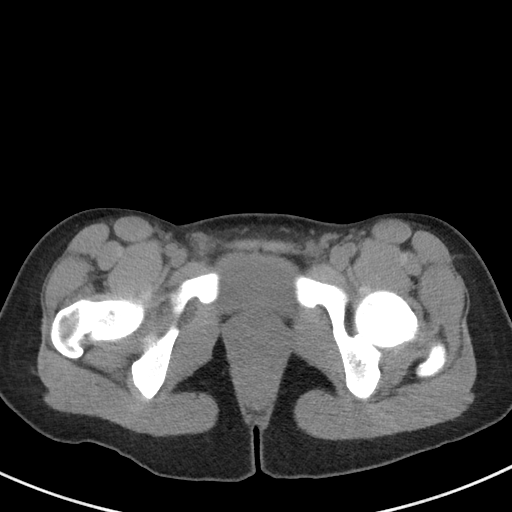
[im 17/83  soft-tissue]
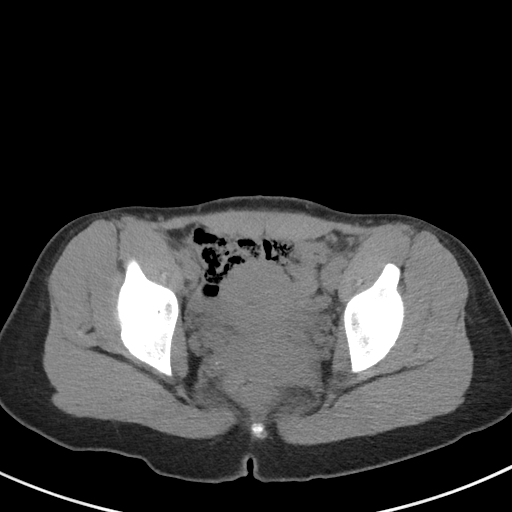
[im 23/83  soft-tissue]
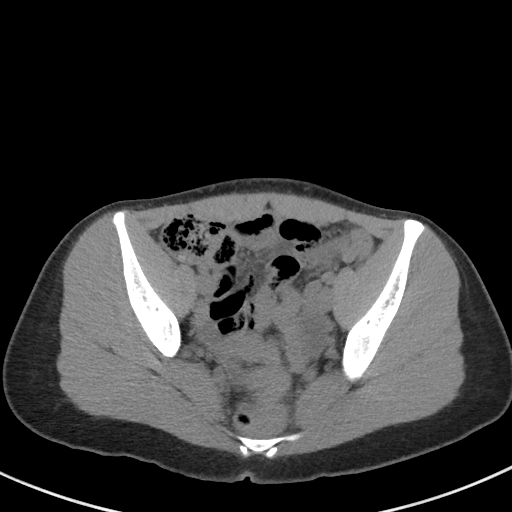
[im 30/83  soft-tissue]
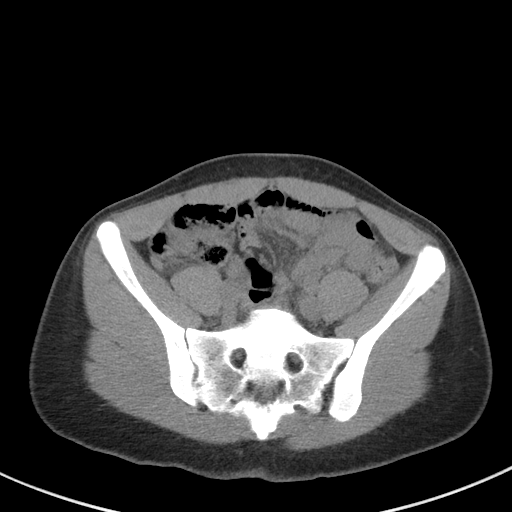
[im 37/83  soft-tissue]
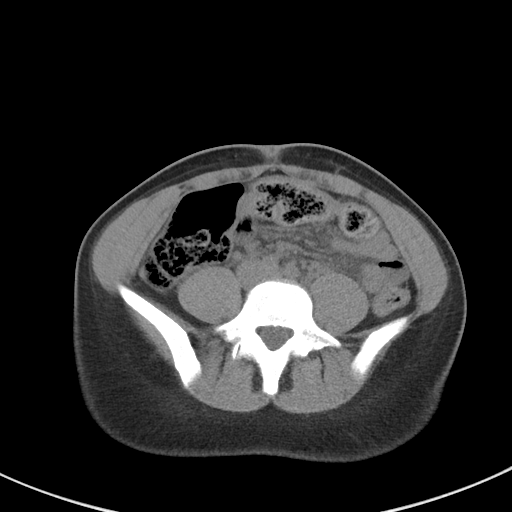
[im 43/83  soft-tissue]
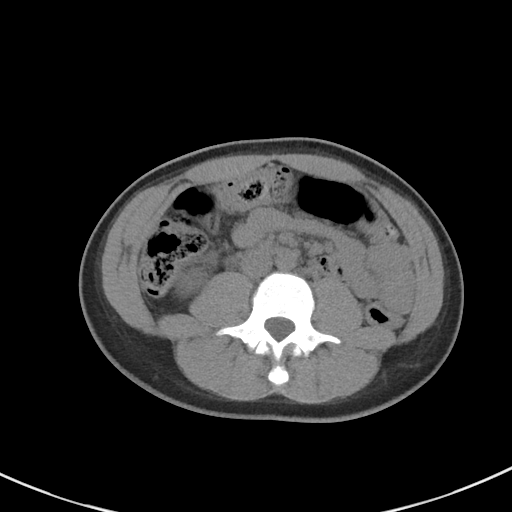
[im 46/83  soft-tissue]
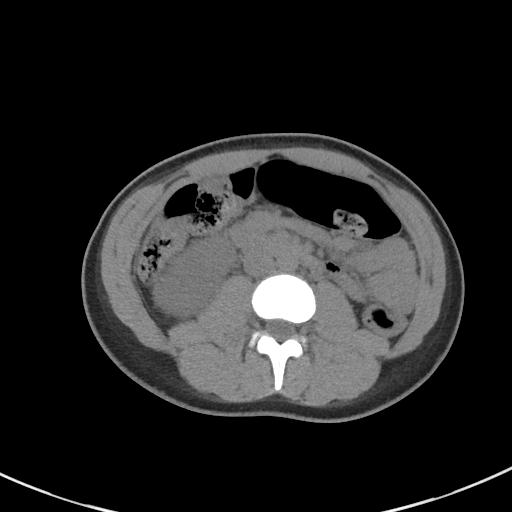
[im 53/83  soft-tissue]
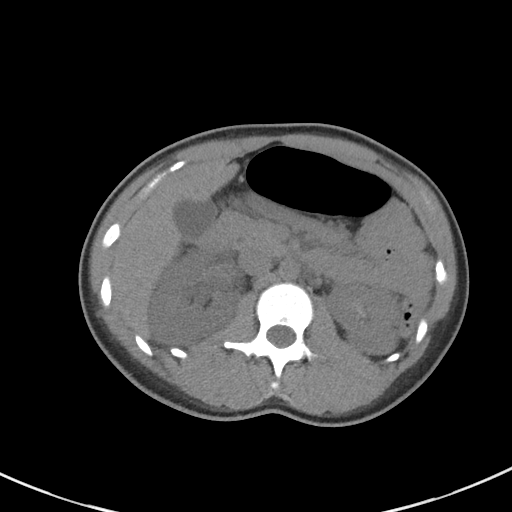
[im 53/83  bone]
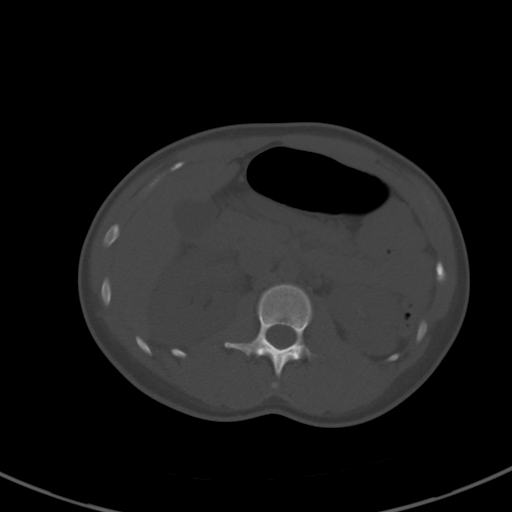
[im 60/83  soft-tissue]
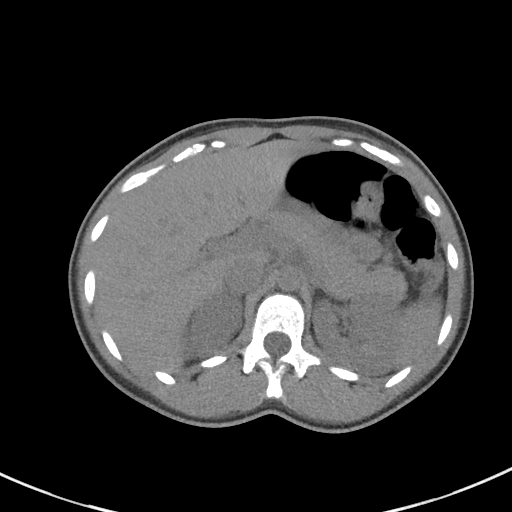
[im 66/83  soft-tissue]
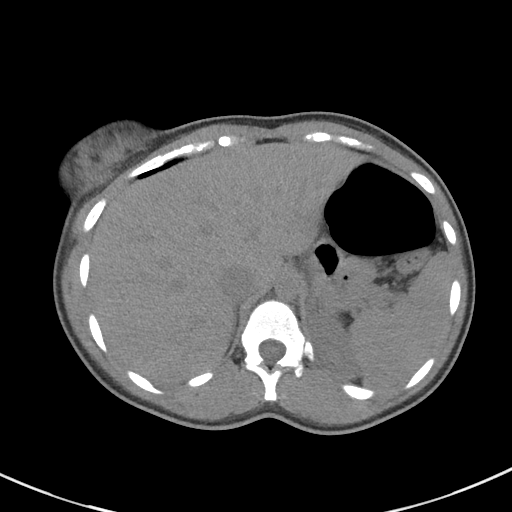
[im 73/83  soft-tissue]
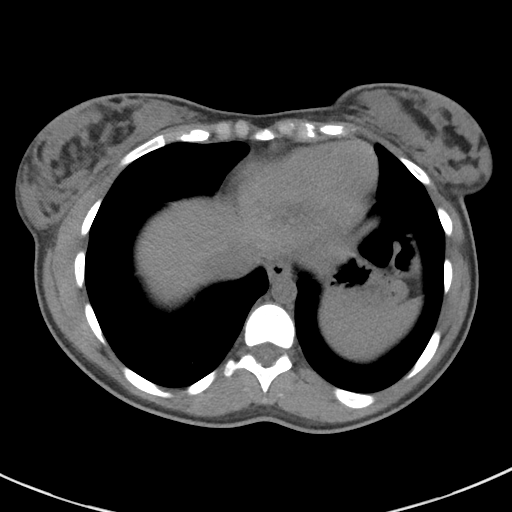
[im 79/83  soft-tissue]
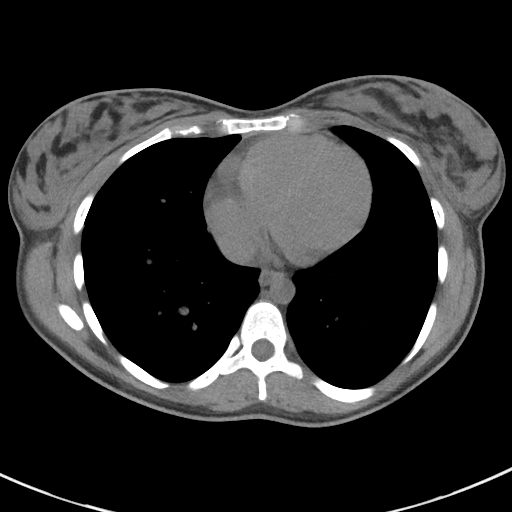

[Series 5: coronal st · coronal · 0.75mm/px · 3 of 66 slices shown]
[im 22/66  soft-tissue]
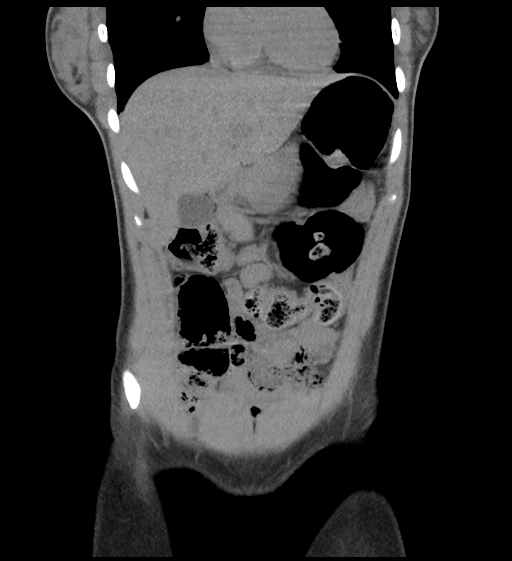
[im 29/66  soft-tissue]
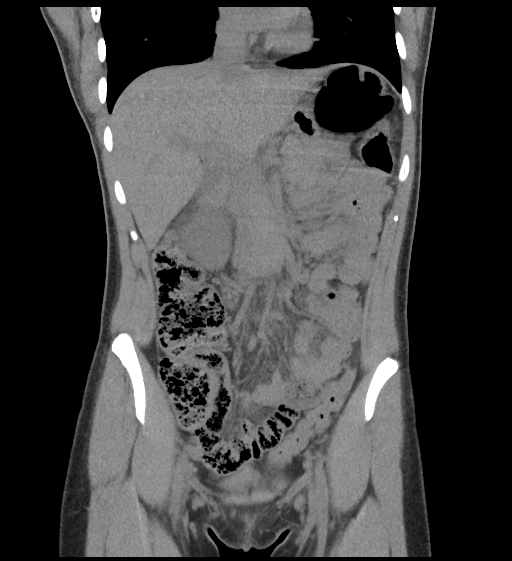
[im 37/66  soft-tissue]
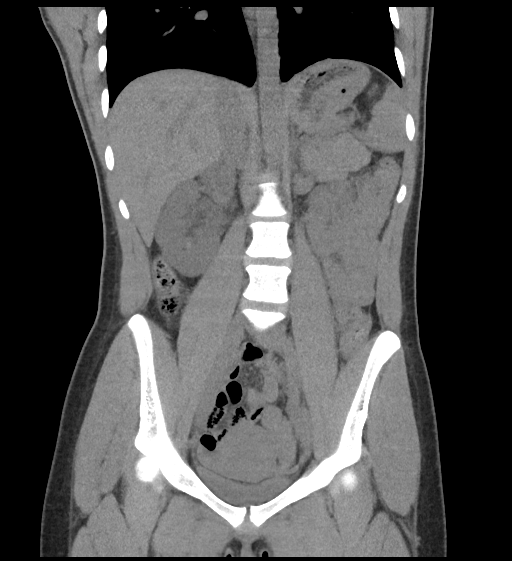

[16 of 46 positions shown; findings below may reference images not displayed]

FINDINGS: Lower chest: The visualized heart size within normal limits. No
pericardial fluid/thickening.

No hiatal hernia.

The visualized portions of the lungs are clear.

Hepatobiliary: Although limited due to the lack of intravenous
contrast, normal in appearance without gross focal abnormality. No
evidence of calcified gallstones or biliary ductal dilatation.

Pancreas:  Unremarkable.  No surrounding inflammatory changes.

Spleen: Normal in size. Although limited due to the lack of
intravenous contrast, normal in appearance.

Adrenals/Urinary Tract: Both adrenal glands appear normal. There is
mild right pelvicaliectasis and proximal ureterectasis seen. However
there is no obstructing stones noted. There is mild perinephric
stranding seen around the pole right kidney. No left-sided renal or
collecting system calculi are seen. The bladder is partially
decompressed.

Stomach/Bowel: The stomach, small bowel, and colon are normal in
appearance. No inflammatory changes or obstructive findings.
appendix is normal.

Vascular/Lymphatic: There are no enlarged abdominal or pelvic lymph
nodes. No significant gross vascular findings are present.

Reproductive: The uterus and adnexa are unremarkable.

Other: No evidence of abdominal wall mass or hernia.

Musculoskeletal: No acute or significant osseous findings.
IMPRESSION: Mild right pelvicaliectasis with a mild amount of perinephric
stranding. No renal or collecting system calculi however are seen.
This could be due to slightly passed stone or mild pyelonephritis.

## 2022-02-04 DIAGNOSIS — J3489 Other specified disorders of nose and nasal sinuses: Secondary | ICD-10-CM | POA: Diagnosis not present

## 2022-02-04 DIAGNOSIS — R0981 Nasal congestion: Secondary | ICD-10-CM | POA: Diagnosis not present

## 2022-02-04 DIAGNOSIS — R519 Headache, unspecified: Secondary | ICD-10-CM | POA: Diagnosis not present

## 2022-02-04 DIAGNOSIS — J029 Acute pharyngitis, unspecified: Secondary | ICD-10-CM | POA: Diagnosis not present

## 2022-04-16 ENCOUNTER — Ambulatory Visit
Admission: EM | Admit: 2022-04-16 | Discharge: 2022-04-16 | Disposition: A | Discharge status: Home / Self Care | Payer: No Typology Code available for payment source | Attending: Internal Medicine | Admitting: Internal Medicine

## 2022-04-16 DIAGNOSIS — J3489 Other specified disorders of nose and nasal sinuses: Secondary | ICD-10-CM | POA: Diagnosis not present

## 2022-04-16 DIAGNOSIS — R0981 Nasal congestion: Secondary | ICD-10-CM

## 2022-04-16 MED ORDER — GUAIFENESIN 200 MG PO TABS: 200.0000 mg | ORAL_TABLET | ORAL | 0 refills | Status: DC | PRN

## 2022-04-16 NOTE — Discharge Instructions (Signed)
Your symptoms are related to allergies or a viral upper respiratory infection.  You have been prescribed a medication to help alleviate symptoms.  Also recommend continuing Zyrtec and Flonase.  Please follow-up if symptoms persist or worsen.

## 2022-04-16 NOTE — ED Provider Notes (Signed)
[name]    CSN: [id number] Arrival date & time: 04/16/22  1752      History   Chief Complaint Chief Complaint  Patient presents with   Allergies    HPI Natalie Dixon is a 21 y.o. female.   Patient presents with congestion, headache, nasal drainage that started about 2 to 3 days ago.  Denies cough, fever, sore throat, ear pain, nausea, vomiting, diarrhea, abdominal pain.  Denies any known sick contacts.  Patient has taken Zyrtec and Flonase with minimal improvement.     Past Medical History:  Diagnosis Date   Acne     Patient Active Problem List   Diagnosis Date Noted   Tension headache, chronic 08/01/2016   Surveillance of implantable subdermal contraceptive 12/04/2015   Menorrhagia with regular cycle 08/30/2015   Migraine without aura 08/30/2015   Iron deficiency anemia 08/30/2015    Past Surgical History:  Procedure Laterality Date   TONSILLECTOMY  2004    OB History   No obstetric history on file.      Home Medications    Prior to Admission medications   Medication Sig Start Date End Date Taking? Authorizing Provider  guaiFENesin 200 MG tablet Take 1 tablet (200 mg total) by mouth every 4 (four) hours as needed. 04/16/22  Yes Aviyon Hocevar, Rolly Salter E, FNP  doxycycline (VIBRAMYCIN) 100 MG capsule Take 1 capsule (100 mg total) by mouth 2 (two) times daily. 03/16/21   Wallis Bamberg, PA-C  etonogestrel (NEXPLANON) 68 MG IMPL implant 1 each by Subdermal route once.    [provider]  fluconazole (DIFLUCAN) 150 MG tablet Take 1 tablet (150 mg total) by mouth once a week. 03/16/21   Wallis Bamberg, PA-C  fluocinolone (SYNALAR) 0.01 % external solution Apply topically 2 (two) times daily. 01/10/20   Janalyn Harder, MD  fluticasone (FLONASE) 50 MCG/ACT nasal spray Place 2 sprays into both nostrils daily. 03/16/21   Wallis Bamberg, PA-C  loperamide (IMODIUM) 2 MG capsule Take 1 capsule (2 mg total) by mouth 2 (two) times daily as needed for diarrhea or loose  stools. 01/13/21   Wallis Bamberg, PA-C  medroxyPROGESTERone Acetate (DEPO-PROVERA IM) Inject into the muscle.    [provider]  montelukast (SINGULAIR) 10 MG tablet Take 1 tablet (10 mg total) by mouth at bedtime. 03/16/21   Wallis Bamberg, PA-C  ondansetron (ZOFRAN ODT) 4 MG disintegrating tablet Take 1 tablet (4 mg total) by mouth every 8 (eight) hours as needed. 01/10/21   Horton, Mayer Masker, MD  predniSONE (DELTASONE) 20 MG tablet Take 2 tablets daily with breakfast. 03/16/21   Wallis Bamberg, PA-C  pseudoephedrine (SUDAFED) 30 MG tablet Take 1 tablet (30 mg total) by mouth every 8 (eight) hours as needed for congestion. 03/16/21   Wallis Bamberg, PA-C  triamcinolone cream (KENALOG) 0.1 % Apply 1 application topically 2 (two) times daily as needed. 05/24/20   Glyn Ade, PA-C    Family History Family History  Problem Relation Age of Onset   Hypertension Mother    Heart attack Maternal Grandmother    Hypertension Maternal Grandmother    Hypertension Maternal Grandfather    Migraines Neg Hx    Seizures Neg Hx    Depression Neg Hx    Anxiety disorder Neg Hx    Bipolar disorder Neg Hx    ADD / ADHD Neg Hx    Autism Neg Hx     Social History Social History   Tobacco Use   Smoking status: Never  Passive exposure: Yes   Smokeless tobacco: Never  Vaping Use   Vaping Use: Never used  Substance Use Topics   Alcohol use: No    Alcohol/week: 0.0 standard drinks of alcohol   Drug use: No     Allergies   Patient has no known allergies.   Review of Systems Review of Systems Per HPI  Physical Exam Triage Vital Signs ED Triage Vitals [04/16/22 1802]  Enc Vitals Group     BP 115/70     Pulse Rate 90     Resp 18     Temp 98 F (36.7 C)     Temp Source Oral     SpO2 99 %     Weight      Height      Head Circumference      Peak Flow      Pain Score 0     Pain Loc      Pain Edu?      Excl. in GC?    No data found.  Updated Vital Signs BP 115/70 (BP Location: Left  Arm)   Pulse 90   Temp 98 F (36.7 C) (Oral)   Resp 18   SpO2 99%   Visual Acuity Right Eye Distance:   Left Eye Distance:   Bilateral Distance:    Right Eye Near:   Left Eye Near:    Bilateral Near:     Physical Exam Constitutional:      General: She is not in acute distress.    Appearance: Normal appearance. She is not toxic-appearing or diaphoretic.  HENT:     Head: Normocephalic and atraumatic.     Right Ear: Tympanic membrane and ear canal normal.     Left Ear: Tympanic membrane and ear canal normal.     Nose: Congestion present.     Mouth/Throat:     Mouth: Mucous membranes are moist.     Pharynx: No posterior oropharyngeal erythema.  Eyes:     Extraocular Movements: Extraocular movements intact.     Conjunctiva/sclera: Conjunctivae normal.     Pupils: Pupils are equal, round, and reactive to light.  Cardiovascular:     Rate and Rhythm: Normal rate and regular rhythm.     Pulses: Normal pulses.     Heart sounds: Normal heart sounds.  Pulmonary:     Effort: Pulmonary effort is normal. No respiratory distress.     Breath sounds: Normal breath sounds. No stridor. No wheezing, rhonchi or rales.  Abdominal:     General: Abdomen is flat. Bowel sounds are normal.     Palpations: Abdomen is soft.  Musculoskeletal:        General: Normal range of motion.     Cervical back: Normal range of motion.  Skin:    General: Skin is warm and dry.  Neurological:     General: No focal deficit present.     Mental Status: She is alert and oriented to person, place, and time. Mental status is at baseline.  Psychiatric:        Mood and Affect: Mood normal.        Behavior: Behavior normal.      UC Treatments / Results  Labs (all labs ordered are listed, but only abnormal results are displayed) Labs Reviewed - No data to display  EKG   Radiology No results found.  Procedures Procedures (including critical care time)  Medications Ordered in UC Medications - No data  to display  Initial Impression /  Assessment and Plan / UC Course  I have reviewed the triage vital signs and the nursing notes.  Pertinent labs & imaging results that were available during my care of the patient were reviewed by me and considered in my medical decision making (see chart for details).     Differential diagnoses include allergic rhinitis versus viral upper respiratory infection.  Patient to continue Zyrtec and Flonase and will add guaifenesin to help alleviate mucus.  Patient offered COVID testing but declined.  Discussed supportive care with patient.  Discussed return precautions.  Patient verbalized understanding and was agreeable with plan. Final Clinical Impressions(s) / UC Diagnoses   Final diagnoses:  Nasal congestion  Nasal drainage     Discharge Instructions      Your symptoms are related to allergies or a viral upper respiratory infection.  You have been prescribed a medication to help alleviate symptoms.  Also recommend continuing Zyrtec and Flonase.  Please follow-up if symptoms persist or worsen.    ED Prescriptions     Medication Sig Dispense Auth. Provider   guaiFENesin 200 MG tablet Take 1 tablet (200 mg total) by mouth every 4 (four) hours as needed. 30 suppository [place], Michele Rockers, [place]      PDMP not reviewed this encounter.   Teodora Medici, [place] 04/16/22 [phone]

## 2022-04-16 NOTE — ED Triage Notes (Signed)
Pt c/o nasal congestion, headache, nasal drainage, onset ~ Monday.

## 2022-06-23 DIAGNOSIS — Z113 Encounter for screening for infections with a predominantly sexual mode of transmission: Secondary | ICD-10-CM | POA: Diagnosis not present

## 2022-06-23 DIAGNOSIS — Z3002 Counseling and instruction in natural family planning to avoid pregnancy: Secondary | ICD-10-CM | POA: Diagnosis not present

## 2022-06-30 ENCOUNTER — Ambulatory Visit: Admit: 2022-06-30 | Discharge status: Absent without leave | Payer: No Typology Code available for payment source

## 2023-02-08 ENCOUNTER — Ambulatory Visit: Payer: Self-pay

## 2023-05-18 ENCOUNTER — Encounter: Payer: Self-pay | Admitting: Family Medicine

## 2023-05-18 ENCOUNTER — Ambulatory Visit: Payer: No Typology Code available for payment source | Admitting: Family Medicine

## 2023-05-18 VITALS — BP 110/62 | HR 84 | Temp 98.8°F | Ht 61.4 in | Wt 115.8 lb

## 2023-05-18 DIAGNOSIS — N920 Excessive and frequent menstruation with regular cycle: Secondary | ICD-10-CM

## 2023-05-18 DIAGNOSIS — R5383 Other fatigue: Secondary | ICD-10-CM

## 2023-05-18 DIAGNOSIS — D509 Iron deficiency anemia, unspecified: Secondary | ICD-10-CM | POA: Diagnosis not present

## 2023-05-18 DIAGNOSIS — Z7689 Persons encountering health services in other specified circumstances: Secondary | ICD-10-CM

## 2023-05-18 MED ORDER — FUSION PLUS PO CAPS: 1.0000 | ORAL_CAPSULE | ORAL | 0 refills | Status: AC

## 2023-05-18 NOTE — Progress Notes (Signed)
I,Jameka J Llittleton, CMA,acting as a Neurosurgeon for [employer], NP.,have documented all relevant documentation on the behalf of Natalie Standish, NP,as directed by  Conchita Truxillo Moshe Salisbury, NP while in the presence of Nestor Wieneke, NP.  Subjective:  Patient ID: Natalie Dixon , female    DOB: November 25, 2000 , 22 y.o.   MRN: [id number]  Chief Complaint  Patient presents with   Establish Care   Fatigue    HPI  Patient presents today to establish care. Patient would like to have her iron levels checked. She reported she has been feeling fatigued and having chills. Patient has a pattern of heavy bleeding during periods, she has symptoms of cold intolerance. Patient goes to [facility], where she had her Nexplanon taken out recently. Patiently graduated from college.     Past Medical History:  Diagnosis Date   Acne    Anemia      Family History  Problem Relation Age of Onset   Hypertension Mother    Heart attack Maternal Grandmother    Hypertension Maternal Grandmother    Hypertension Maternal Grandfather    Migraines Neg Hx    Seizures Neg Hx    Depression Neg Hx    Anxiety disorder Neg Hx    Bipolar disorder Neg Hx    ADD / ADHD Neg Hx    Autism Neg Hx      Current Outpatient Medications:    Iron-FA-B Cmp-C-Biot-Probiotic (FUSION PLUS) CAPS, Take 1 capsule by mouth 1 day or 1 dose for 30 doses., Disp: 30 capsule, Rfl: 0   No Known Allergies   Review of Systems  Constitutional:  Positive for chills and fatigue.  HENT: Negative.    Eyes: Negative.   Respiratory: Negative.    Cardiovascular: Negative.   Gastrointestinal: Negative.   Endocrine: Positive for cold intolerance.  Musculoskeletal: Negative.   Skin: Negative.   Neurological: Negative.   Psychiatric/Behavioral: Negative.       Today's Vitals   05/18/23 0924  BP: 110/62  Pulse: 84  Temp: 98.8 F (37.1 C)  Weight: 115 lb 12.8 oz (52.5 kg)  Height: 5' 1.4" (1.56 m)  PainSc: 0-No pain   Body mass index is 21.6  kg/m.  Wt Readings from Last 3 Encounters:  05/18/23 115 lb 12.8 oz (52.5 kg)  01/10/21 110 lb (49.9 kg) (15%, Z= -1.02)*  08/27/20 110 lb (49.9 kg) (16%, Z= -0.99)*   * Growth percentiles are based on CDC (Girls, 2-20 Years) data.     Objective:  Physical Exam Cardiovascular:     Rate and Rhythm: Normal rate and regular rhythm.  Pulmonary:     Effort: Pulmonary effort is normal.     Breath sounds: Normal breath sounds.  Abdominal:     General: Abdomen is flat.     Palpations: Abdomen is soft.  Musculoskeletal:     Cervical back: Normal range of motion.  Skin:    General: Skin is warm and dry.  Neurological:     General: No focal deficit present.     Mental Status: She is alert and oriented to person, place, and time.         Assessment And Plan:  Establishing care with new doctor, encounter for  Fatigue, unspecified type Assessment & Plan: Check labs.  Orders: -     Iron, TIBC and Ferritin Panel -     TSH + free T4  Menorrhagia with regular cycle Assessment & Plan: Followed by [facility]  Orders: -  CBC with Differential/Platelet -     CMP14+EGFR  Iron deficiency anemia, unspecified iron deficiency anemia type Assessment & Plan: Gave sample of Iron Fusion plus in the office.   Other orders -     Fusion Plus; Take 1 capsule by mouth 1 day or 1 dose for 30 doses.  Dispense: 30 capsule; Refill: 0     Return in about 4 months (around 09/17/2023) for physical.  Patient was given opportunity to ask questions. Patient verbalized understanding of the plan and was able to repeat key elements of the plan. All questions were answered to their satisfaction.  Florestine Carmical Moshe Salisbury, NP  I, Shanvi Moyd Moshe Salisbury, NP, have reviewed all documentation for this visit. The documentation on 05/28/23 for the exam, diagnosis, procedures, and orders are all accurate and complete.   IF YOU HAVE BEEN REFERRED TO A SPECIALIST, IT MAY TAKE 1-2 WEEKS TO SCHEDULE/PROCESS THE  REFERRAL. IF YOU HAVE NOT HEARD FROM US/SPECIALIST IN TWO WEEKS, PLEASE GIVE [place] A CALL AT [phone].   THE PATIENT IS ENCOURAGED TO PRACTICE SOCIAL DISTANCING DUE TO THE COVID-19 PANDEMIC.

## 2023-05-28 DIAGNOSIS — R5383 Other fatigue: Secondary | ICD-10-CM | POA: Insufficient documentation

## 2023-05-28 NOTE — Assessment & Plan Note (Signed)
Check labs 

## 2023-05-28 NOTE — Assessment & Plan Note (Signed)
Followed by [facility]

## 2023-05-28 NOTE — Assessment & Plan Note (Signed)
Gave sample of Iron Fusion plus in the office.

## 2023-09-18 ENCOUNTER — Encounter: Payer: Self-pay | Admitting: Family Medicine

## 2024-01-06 ENCOUNTER — Ambulatory Visit: Admitting: Family Medicine

## 2024-01-07 ENCOUNTER — Ambulatory Visit: Admitting: Family Medicine

## 2024-02-05 ENCOUNTER — Emergency Department (HOSPITAL_BASED_OUTPATIENT_CLINIC_OR_DEPARTMENT_OTHER)

## 2024-02-05 ENCOUNTER — Emergency Department (HOSPITAL_BASED_OUTPATIENT_CLINIC_OR_DEPARTMENT_OTHER)
Admission: EM | Admit: 2024-02-05 | Discharge: 2024-02-05 | Disposition: A | Discharge status: Home / Self Care | Attending: Emergency Medicine | Admitting: Emergency Medicine

## 2024-02-05 ENCOUNTER — Encounter (HOSPITAL_BASED_OUTPATIENT_CLINIC_OR_DEPARTMENT_OTHER): Payer: Self-pay | Admitting: Urology

## 2024-02-05 ENCOUNTER — Other Ambulatory Visit: Payer: Self-pay

## 2024-02-05 DIAGNOSIS — N1330 Unspecified hydronephrosis: Secondary | ICD-10-CM | POA: Insufficient documentation

## 2024-02-05 DIAGNOSIS — R109 Unspecified abdominal pain: Secondary | ICD-10-CM

## 2024-02-05 LAB — CBC
HCT: 34.4 % — ABNORMAL LOW (ref 36.0–46.0)
Hemoglobin: 11.4 g/dL — ABNORMAL LOW (ref 12.0–15.0)
MCH: 27.2 pg (ref 26.0–34.0)
MCHC: 33.1 g/dL (ref 30.0–36.0)
MCV: 82.1 fL (ref 80.0–100.0)
Platelets: 326 10*3/uL (ref 150–400)
RBC: 4.19 MIL/uL (ref 3.87–5.11)
RDW: 14.8 % (ref 11.5–15.5)
WBC: 9.4 10*3/uL (ref 4.0–10.5)
nRBC: 0 % (ref 0.0–0.2)

## 2024-02-05 LAB — BASIC METABOLIC PANEL WITH GFR
Anion gap: 16 — ABNORMAL HIGH (ref 5–15)
BUN: 11 mg/dL (ref 6–20)
CO2: 18 mmol/L — ABNORMAL LOW (ref 22–32)
Calcium: 9.6 mg/dL (ref 8.9–10.3)
Chloride: 108 mmol/L (ref 98–111)
Creatinine, Ser: 0.86 mg/dL (ref 0.44–1.00)
GFR, Estimated: >60 mL/min (ref >60)
Glucose, Bld: 138 mg/dL — ABNORMAL HIGH (ref 70–99)
Potassium: 3.7 mmol/L (ref 3.5–5.1)
Sodium: 141 mmol/L (ref 135–145)

## 2024-02-05 LAB — URINALYSIS, ROUTINE W REFLEX MICROSCOPIC
Bilirubin Urine: NEGATIVE
Glucose, UA: NEGATIVE mg/dL
Ketones, ur: 40 mg/dL — AB
Leukocytes,Ua: NEGATIVE
Nitrite: NEGATIVE
Protein, ur: 30 mg/dL — AB
Specific Gravity, Urine: 1.03 (ref 1.005–1.030)
pH: 8 (ref 5.0–8.0)

## 2024-02-05 LAB — URINALYSIS, MICROSCOPIC (REFLEX)

## 2024-02-05 LAB — HCG, SERUM, QUALITATIVE: Preg, Serum: NEGATIVE

## 2024-02-05 MED ORDER — OXYCODONE-ACETAMINOPHEN 5-325 MG PO TABS
1.0000 | ORAL_TABLET | ORAL | 0 refills | Status: DC | PRN
Start: 2024-02-05 — End: 2024-05-16

## 2024-02-05 MED ORDER — HYDROMORPHONE HCL 1 MG/ML IJ SOLN
1.0000 mg | Freq: Once | INTRAMUSCULAR | Status: AC
  Administered 2024-02-05: 1 mg via INTRAVENOUS
  Filled 2024-02-05: qty 1

## 2024-02-05 MED ORDER — ONDANSETRON 4 MG PO TBDP: 4.0000 mg | ORAL_TABLET | Freq: Three times a day (TID) | ORAL | 0 refills | Status: DC | PRN

## 2024-02-05 MED ORDER — ONDANSETRON HCL 4 MG/2ML IJ SOLN
4.0000 mg | Freq: Once | INTRAMUSCULAR | Status: AC | PRN
Start: 2024-02-05 — End: 2024-02-05
  Administered 2024-02-05: 4 mg via INTRAVENOUS
  Filled 2024-02-05: qty 2

## 2024-02-05 NOTE — ED Provider Notes (Signed)
 [place] [facility] Provider Note   CSN: [id number] Arrival date & time: 02/05/24  1259     History  Chief Complaint  Patient presents with   Flank Pain    Natalie Dixon is a 23 y.o. female.  Patient complains of sudden onset of severe left flank pain today.  Patient reports that she has had a similar episode in the past when she had a kidney stone.  Patient reports that she is currently on her menstrual cycle and does not know if she has blood in her urine.  Patient reports that this pain feels like the pain that she has had in the past with kidney stone.  Patient denies any fever or chills.  Patient is nauseated.  She was reports vomiting earlier today.  Patient denies any diarrhea.  The history is provided by the patient. No language interpreter was used.  Flank Pain       Home Medications Prior to Admission medications   Medication Sig Start Date End Date Taking? Authorizing Provider  ondansetron  (ZOFRAN -ODT) 4 MG disintegrating tablet Take 1 tablet (4 mg total) by mouth every 8 (eight) hours as needed for nausea or vomiting. 02/05/24  Yes Rhyker Silversmith K, PA-C  oxyCODONE -acetaminophen  (PERCOCET) 5-325 MG tablet Take 1 tablet by mouth every 4 (four) hours as needed for severe pain (pain score 7-10). 02/05/24 02/04/25 Yes Lydell Moga K, PA-C      Allergies    Patient has no known allergies.    Review of Systems   Review of Systems  Genitourinary:  Positive for flank pain.  All other systems reviewed and are negative.   Physical Exam Updated Vital Signs BP 129/85 (BP Location: Right Arm)   Pulse 89   Temp 97.9 F (36.6 C) (Oral)   Resp 18   Ht 5\' 2"  (1.575 m)   Wt 52.5 kg   LMP 02/05/2024   SpO2 100%   BMI 21.17 kg/m  Physical Exam Vitals and nursing note reviewed.  Constitutional:      Appearance: She is well-developed.  HENT:     Head: Normocephalic.  Cardiovascular:     Rate and Rhythm: Normal rate.   Pulmonary:     Effort: Pulmonary effort is normal.  Abdominal:     General: Abdomen is flat. There is no distension.     Palpations: Abdomen is soft.     Comments: Tender left flank.  Musculoskeletal:        General: Normal range of motion.     Cervical back: Normal range of motion.  Skin:    General: Skin is warm.  Neurological:     General: No focal deficit present.     Mental Status: She is alert and oriented to person, place, and time.  Psychiatric:        Mood and Affect: Mood normal.     ED Results / Procedures / Treatments   Labs (all labs ordered are listed, but only abnormal results are displayed) Labs Reviewed  URINALYSIS, ROUTINE W REFLEX MICROSCOPIC - Abnormal; Notable for the following components:      Result Value   Hgb urine dipstick LARGE (*)    Ketones, ur 40 (*)    Protein, ur 30 (*)    All other components within normal limits  BASIC METABOLIC PANEL WITH GFR - Abnormal; Notable for the following components:   CO2 18 (*)    Glucose, Bld 138 (*)    Anion gap 16 (*)  All other components within normal limits  CBC - Abnormal; Notable for the following components:   Hemoglobin 11.4 (*)    HCT 34.4 (*)    All other components within normal limits  URINALYSIS, MICROSCOPIC (REFLEX) - Abnormal; Notable for the following components:   Bacteria, UA FEW (*)    All other components within normal limits  HCG, SERUM, QUALITATIVE    EKG None  Radiology CT Renal Stone Study Result Date: 02/05/2024 CLINICAL DATA:  Left flank pain since earlier today EXAM: CT ABDOMEN AND PELVIS WITHOUT CONTRAST TECHNIQUE: Multidetector CT imaging of the abdomen and pelvis was performed following the standard protocol without IV contrast. RADIATION DOSE REDUCTION: This exam was performed according to the departmental dose-optimization program which includes automated exposure control, adjustment of the mA and/or kV according to patient size and/or use of iterative reconstruction  technique. COMPARISON:  02/08/2020 FINDINGS: Lower chest: No acute pleural or parenchymal lung disease. Hepatobiliary: Unremarkable unenhanced appearance of the liver and gallbladder. No biliary duct dilation. Pancreas: Unremarkable unenhanced appearance. Spleen: Unremarkable unenhanced appearance. Adrenals/Urinary Tract: The left kidney is enlarged and edematous, with mild left perinephric fat stranding. Mild left hydronephrosis and hydroureter, without obstructing calculus identified. Increased attenuation of the right renal medullary pyramids could reflect underlying nephrocalcinosis or dehydration. Otherwise unremarkable unenhanced appearance of the right kidney. The adrenals are unremarkable. Bladder is decompressed, limiting its evaluation. Stomach/Bowel: No bowel obstruction or ileus. Normal retrocecal appendix. No bowel wall thickening or inflammatory change. Vascular/Lymphatic: No significant vascular findings are present. No enlarged abdominal or pelvic lymph nodes. Reproductive: Uterus and bilateral adnexa are unremarkable. Other: No free fluid or free intraperitoneal gas. No abdominal wall hernia. Musculoskeletal: No acute or destructive bony abnormalities. Reconstructed images demonstrate no additional findings. IMPRESSION: 1. Edematous enlarged left kidney, with mild left perinephric fat stranding and mild left hydroureteronephrosis. No obstructing calculus is identified. Findings could reflect radiolucent obstructing ureteral calculus, recent passage of a urinary tract calculus, or underlying infection and pyelonephritis. Please correlate with urinalysis. 2. Increased attenuation right renal medullary pyramids could reflect sequela of nephro calcinosis or dehydration. Electronically Signed   By: Bobbye Burrow M.D.   On: 02/05/2024 15:25    Procedures Procedures    Medications Ordered in ED Medications  ondansetron  (ZOFRAN ) injection 4 mg (4 mg Intravenous Given 02/05/24 1318)  HYDROmorphone   (DILAUDID ) injection 1 mg (1 mg Intravenous Given 02/05/24 1418)    ED Course/ Medical Decision Making/ A&P                                 Medical Decision Making Patient complains of sudden onset of left flank pain.  Patient reports that she had similar pain in the past when she had a kidney stone.  Amount and/or Complexity of Data Reviewed Labs: ordered. Decision-making details documented in ED Course.    Details: Urine pregnancy is negative.  UA shows a large amount of blood.  BUN and creatinine are normal patient has a normal GFR. Radiology: ordered.    Details: CT renal shows left hydronephrosis no obvious stone.  Risk Prescription drug management. Risk Details: Patient was given IV Zofran  and Dilaudid .  Patient reports that she is completely pain-free.  I discussed patient's results with her.  Patient is advised to follow-up with urology.  She is given a prescription for Percocet and Zofran .  I suspect patient has passed stone.  Patient is advised to follow-up with urology.  Patient is discharged in stable condition.  Pain-free           Final Clinical Impression(s) / ED Diagnoses Final diagnoses:  Left flank pain  Hydronephrosis, unspecified hydronephrosis type    Rx / DC Orders ED Discharge Orders          Ordered    oxyCODONE -acetaminophen  (PERCOCET) 5-325 MG tablet  Every 4 hours PRN        02/05/24 1606    ondansetron  (ZOFRAN -ODT) 4 MG disintegrating tablet  Every 8 hours PRN        02/05/24 1606           An After Visit Summary was printed and given to the patient.    Eulah Walkup K, PA-C 02/05/24 Velton Gibbon, MD 02/07/24 [phone]

## 2024-02-05 NOTE — Discharge Instructions (Addendum)
Return if any problems.  Schedule to see Urology for evaluation  

## 2024-02-05 NOTE — ED Triage Notes (Signed)
 Left side flank pain that started this am  Reports N/V as well  Also states urinary frequency

## 2024-05-16 ENCOUNTER — Ambulatory Visit: Payer: Self-pay

## 2024-05-16 ENCOUNTER — Ambulatory Visit

## 2024-05-16 ENCOUNTER — Other Ambulatory Visit: Payer: Self-pay

## 2024-05-16 ENCOUNTER — Encounter: Payer: Self-pay | Admitting: *Deleted

## 2024-05-16 ENCOUNTER — Ambulatory Visit: Admission: EM | Admit: 2024-05-16 | Discharge: 2024-05-16 | Disposition: A | Discharge status: Home / Self Care

## 2024-05-16 DIAGNOSIS — H6123 Impacted cerumen, bilateral: Secondary | ICD-10-CM | POA: Diagnosis not present

## 2024-05-16 NOTE — ED Provider Notes (Signed)
 [name]    CSN: [id number] Arrival date & time: 05/16/24  1704      History   Chief Complaint Chief Complaint  Patient presents with   Otalgia    HPI Cadence Natalie Dixon is a 23 y.o. female.  2 day history of right ear pain, with muffled hearing Tickle started in the throat last night No congestion, cough, fever  Used benadryl   She does use q-tips   Past Medical History:  Diagnosis Date   Acne    Anemia     Patient Active Problem List   Diagnosis Date Noted   Fatigue 05/28/2023   Tension headache, chronic 08/01/2016   Establishing care with new doctor, encounter for 12/04/2015   Menorrhagia with regular cycle 08/30/2015   Migraine without aura 08/30/2015   Iron deficiency anemia 08/30/2015    Past Surgical History:  Procedure Laterality Date   TONSILLECTOMY  2004    OB History   No obstetric history on file.      Home Medications    Prior to Admission medications   Medication Sig Start Date End Date Taking? Authorizing Provider  valACYclovir (VALTREX) 1000 MG tablet Take 1,000 mg by mouth 2 (two) times daily. 12/25/23  Yes [provider]    Family History Family History  Problem Relation Age of Onset   Hypertension Mother    Heart attack Maternal Grandmother    Hypertension Maternal Grandmother    Hypertension Maternal Grandfather    Migraines Neg Hx    Seizures Neg Hx    Depression Neg Hx    Anxiety disorder Neg Hx    Bipolar disorder Neg Hx    ADD / ADHD Neg Hx    Autism Neg Hx     Social History Social History   Tobacco Use   Smoking status: Never    Passive exposure: Yes   Smokeless tobacco: Never  Vaping Use   Vaping status: Never Used  Substance Use Topics   Alcohol use: Yes    Comment: occasional   Drug use: No     Allergies   Patient has no known allergies.   Review of Systems Review of Systems  HENT:  Positive for ear pain.     As per HPI  Physical Exam Triage Vital Signs ED Triage  Vitals  Encounter Vitals Group     BP 05/16/24 1806 119/78     Girls Systolic BP Percentile --      Girls Diastolic BP Percentile --      Boys Systolic BP Percentile --      Boys Diastolic BP Percentile --      Pulse Rate 05/16/24 1806 62     Resp 05/16/24 1806 16     Temp 05/16/24 1806 98.5 F (36.9 C)     Temp Source 05/16/24 1806 Oral     SpO2 05/16/24 1806 98 %     Weight --      Height --      Head Circumference --      Peak Flow --      Pain Score 05/16/24 1803 7     Pain Loc --      Pain Education --      Exclude from Growth Chart --    No data found.  Updated Vital Signs BP 119/78 (BP Location: Left Arm)   Pulse 62   Temp 98.5 F (36.9 C) (Oral)   Resp 16   LMP 04/30/2024   SpO2 98%  Physical Exam Vitals and nursing note reviewed.  Constitutional:      General: She is not in acute distress. HENT:     Right Ear: External ear normal. There is impacted cerumen.     Left Ear: External ear normal. There is impacted cerumen.     Ears:     Comments: Hard wax in bilateral ears. Cannot visualize TMs    Nose: No congestion.     Mouth/Throat:     Mouth: Mucous membranes are moist.     Pharynx: Oropharynx is clear.  Eyes:     Conjunctiva/sclera: Conjunctivae normal.  Cardiovascular:     Rate and Rhythm: Normal rate and regular rhythm.     Heart sounds: Normal heart sounds.  Pulmonary:     Effort: Pulmonary effort is normal.     Breath sounds: Normal breath sounds.  Lymphadenopathy:     Cervical: No cervical adenopathy.  Neurological:     Mental Status: She is alert and oriented to person, place, and time.     UC Treatments / Results  Labs (all labs ordered are listed, but only abnormal results are displayed) Labs Reviewed - No data to display  EKG  Radiology No results found.  Procedures Procedures  Medications Ordered in UC Medications - No data to display  Initial Impression / Assessment and Plan / UC Course  I have reviewed the triage  vital signs and the nursing notes.  Pertinent labs & imaging results that were available during my care of the patient were reviewed by me and considered in my medical decision making (see chart for details).  Ear irrigation successful bilaterally.  Both canals are now clear, TMs are visualized and normal.  We have discussed use of Debrox twice weekly to prevent wax buildup, and discontinuing use of Q-tips.  She can return if needed with any questions or new symptoms.  Agrees to plan  Final Clinical Impressions(s) / UC Diagnoses   Final diagnoses:  Bilateral impacted cerumen     Discharge Instructions      Debrox ear drops can be used twice weekly for prevention of wax buildup      ED Prescriptions   None    PDMP not reviewed this encounter.   [place] Marney, PA-C 05/16/24 [id number]

## 2024-05-16 NOTE — Discharge Instructions (Addendum)
 Debrox ear drops can be used twice weekly for prevention of wax buildup

## 2024-05-16 NOTE — ED Triage Notes (Signed)
 Right ear pain since Saturday. Also reports sounds are muffled. Last night her throat started to have a tickle. She has taken benadryl for her symptoms. Denies fever.

## 2024-07-22 ENCOUNTER — Encounter: Payer: Self-pay | Admitting: Family Medicine
